# Patient Record
Sex: Female | Born: 1945 | Race: White | Hispanic: No | Marital: Married | Smoking: Former smoker
Health system: Southern US, Community
[De-identification: ages and names within clinical notes are randomized; demographics above are authoritative.]

## PROBLEM LIST (undated history)

## (undated) HISTORY — PX: LOBECTOMY: SHX5089

---

## 2012-03-14 ENCOUNTER — Ambulatory Visit: Payer: Self-pay

## 2012-09-27 DIAGNOSIS — M069 Rheumatoid arthritis, unspecified: Secondary | ICD-10-CM | POA: Insufficient documentation

## 2012-09-27 DIAGNOSIS — G4710 Hypersomnia, unspecified: Secondary | ICD-10-CM | POA: Insufficient documentation

## 2012-12-28 IMAGING — CR DG CHEST 2V
1 series · 2 of 2 positions shown · non-contrast
Comparison: none

REASON FOR EXAM: cough and chest congestion for 2 weeks
COMMENTS:

[Series 1: pa · 0.17mm/px · 2 of 2 slices shown]
[im 1/2]
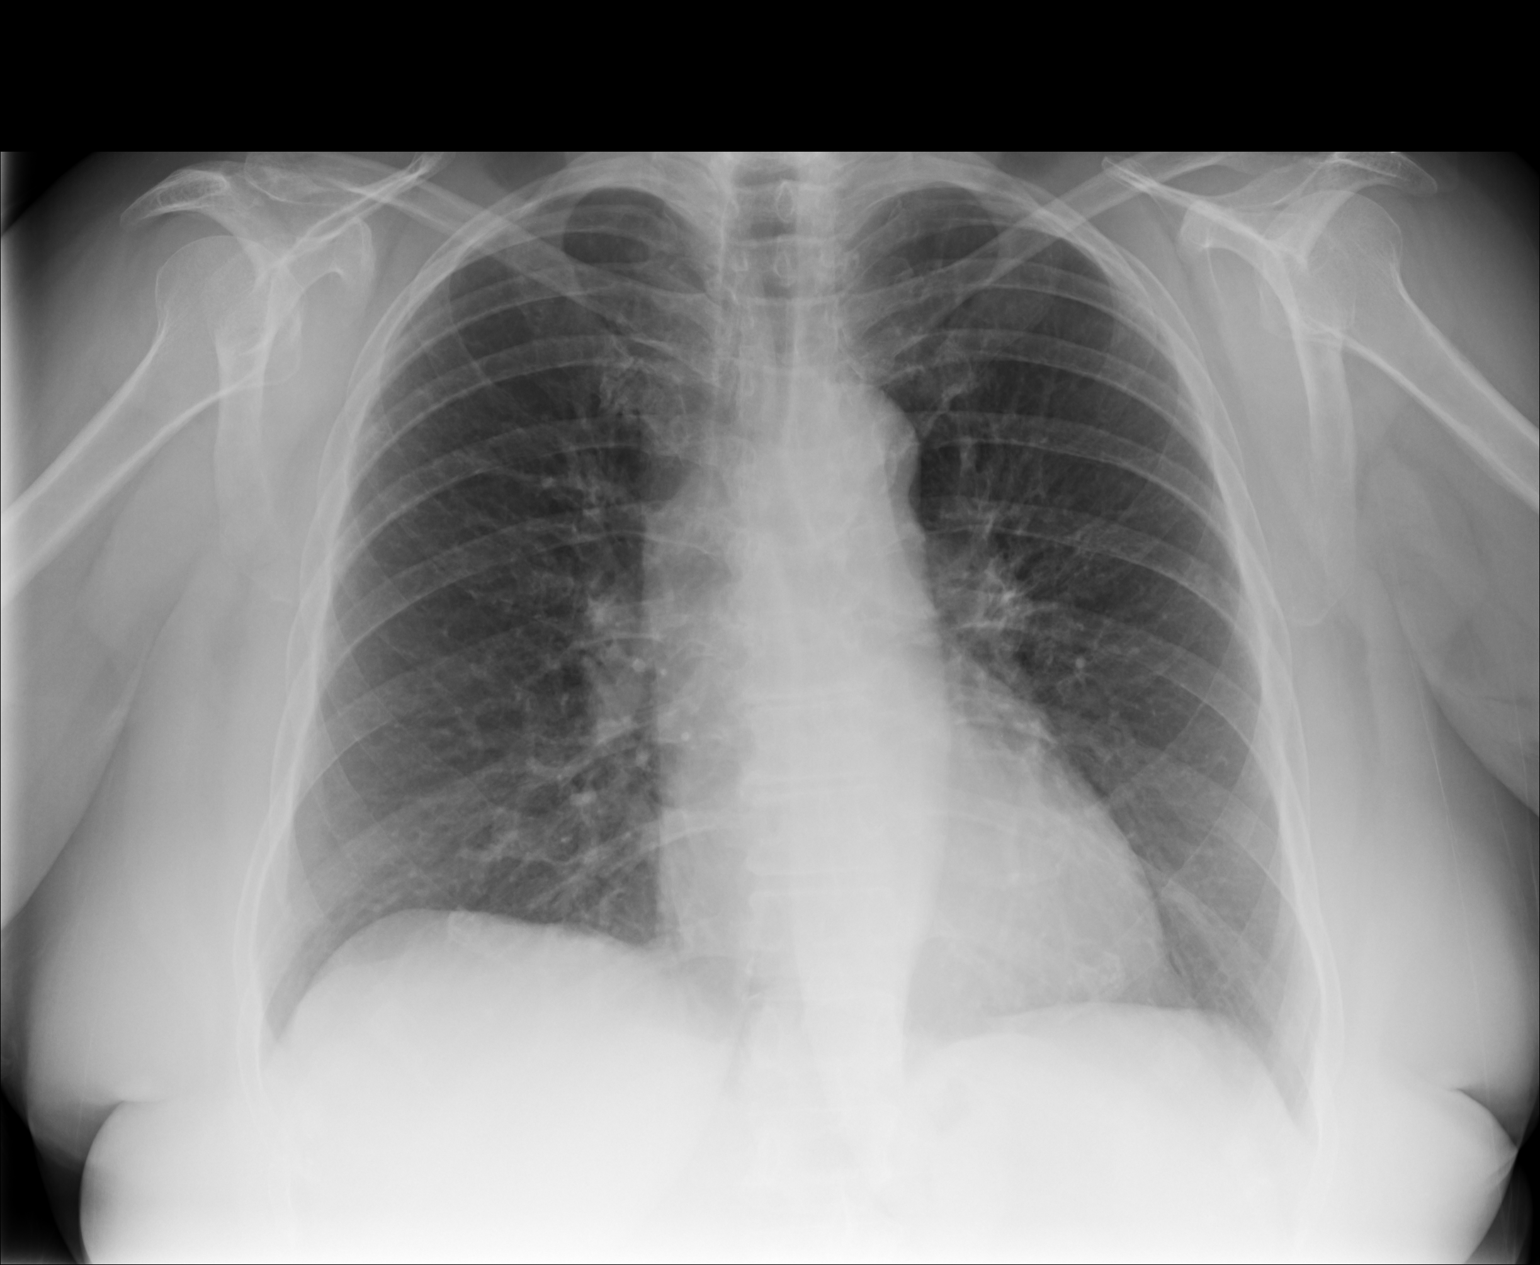
[im 2/2]
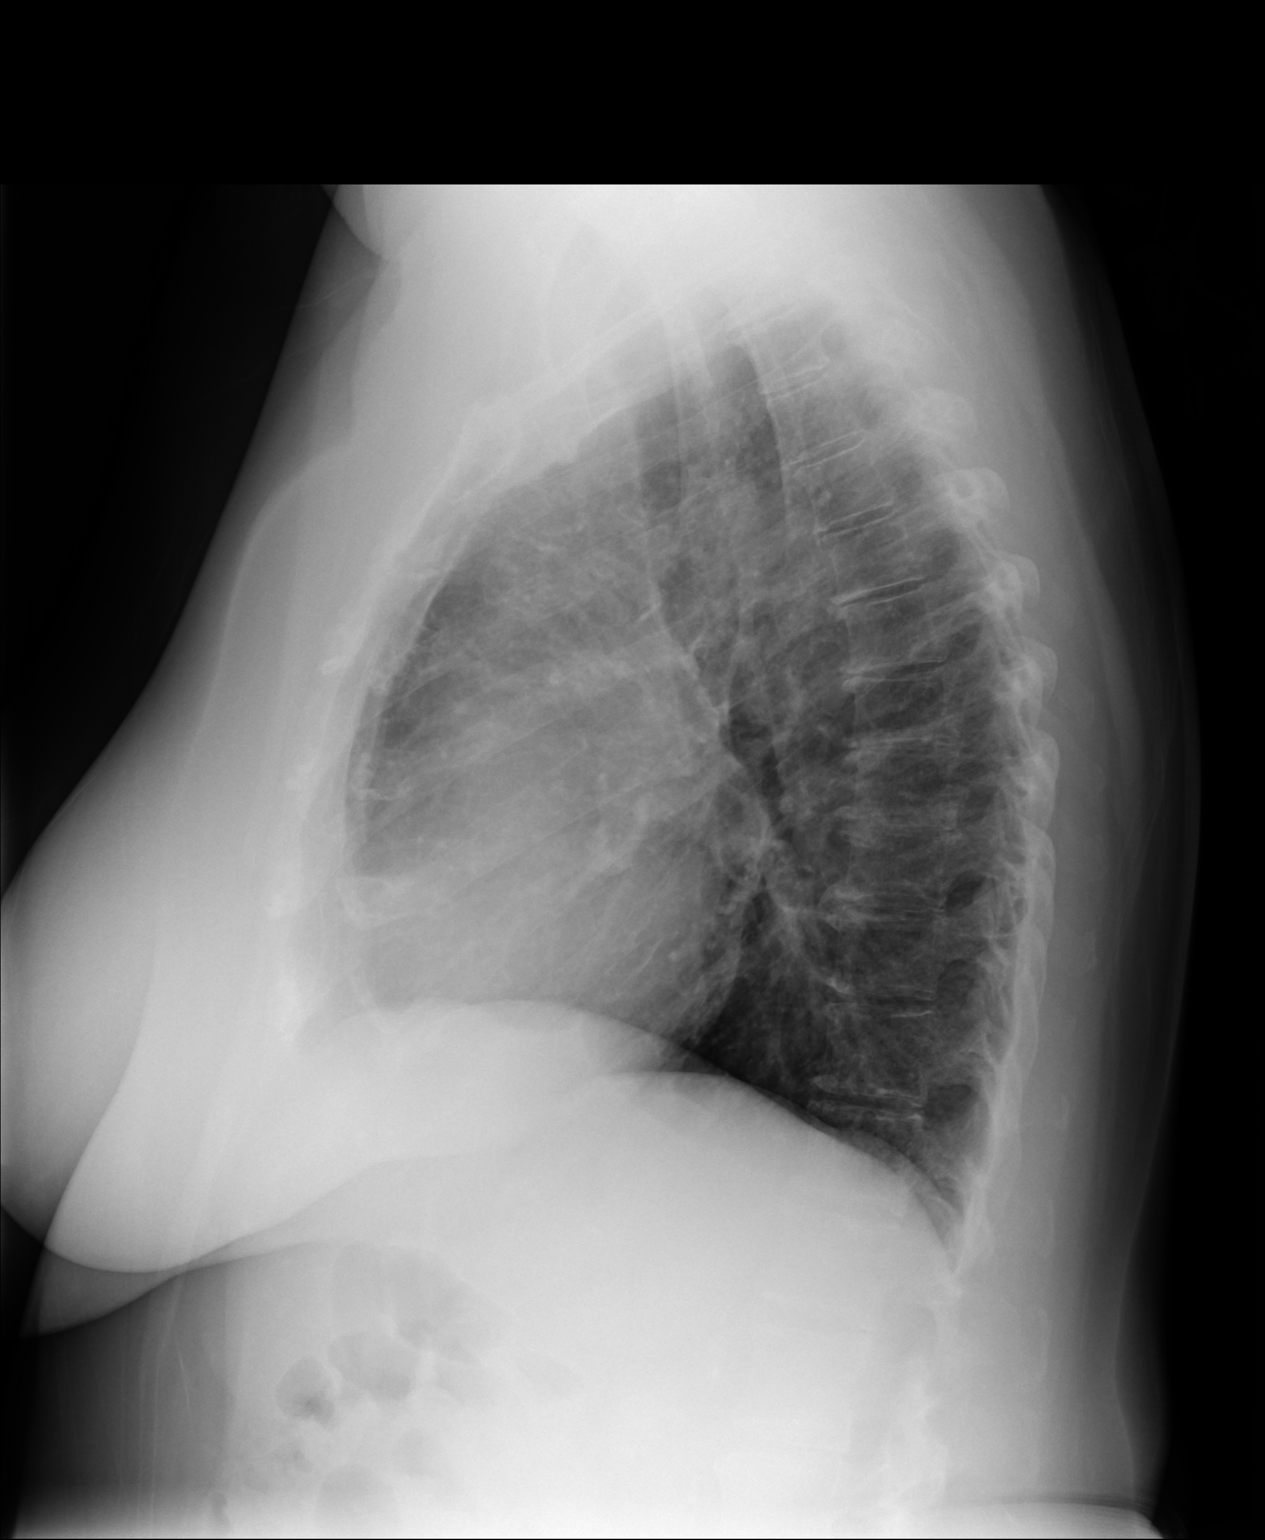

[2 of 2 positions shown; findings below may reference images not displayed]

PROCEDURE:     MDR - MDR CHEST PA(OR AP) AND LATERAL  - March 14, 2012  [DATE]

RESULT:     The lungs are well-expanded. There is no focal infiltrate. The
perihilar lung markings are minimally prominent especially inferiorly on the
right. The cardiac silhouette is normal in size. The pulmonary vascularity
is not engorged. The mediastinum is normal in width. There is mild
tortuosity of the descending thoracic aorta. Degenerative disc space
narrowing is noted at multiple levels of the thoracic spine.
IMPRESSION: There is mild hyperinflation with increased perihilar lung
markings. This suggests acute bronchitis with superimposed subsegmental
atelectasis or early interstitial infiltrates. Followup films are
recommended following therapy to assure complete clearing.

[REDACTED]

## 2016-03-04 DIAGNOSIS — Z902 Acquired absence of lung [part of]: Secondary | ICD-10-CM | POA: Insufficient documentation

## 2017-12-14 DIAGNOSIS — R0902 Hypoxemia: Secondary | ICD-10-CM | POA: Insufficient documentation

## 2017-12-14 DIAGNOSIS — K219 Gastro-esophageal reflux disease without esophagitis: Secondary | ICD-10-CM | POA: Insufficient documentation

## 2017-12-24 DIAGNOSIS — J849 Interstitial pulmonary disease, unspecified: Secondary | ICD-10-CM | POA: Insufficient documentation

## 2020-04-13 DIAGNOSIS — Z8739 Personal history of other diseases of the musculoskeletal system and connective tissue: Secondary | ICD-10-CM | POA: Insufficient documentation

## 2020-09-11 ENCOUNTER — Ambulatory Visit: Payer: Medicare Other

## 2020-10-10 ENCOUNTER — Ambulatory Visit (INDEPENDENT_AMBULATORY_CARE_PROVIDER_SITE_OTHER): Payer: Medicare Other | Admitting: Internal Medicine

## 2020-10-10 VITALS — BP 136/77 | HR 65 | Temp 98.2°F | Resp 18 | Ht 61.0 in | Wt 168.0 lb

## 2020-10-10 DIAGNOSIS — G4733 Obstructive sleep apnea (adult) (pediatric): Secondary | ICD-10-CM | POA: Diagnosis not present

## 2020-10-10 DIAGNOSIS — I10 Essential (primary) hypertension: Secondary | ICD-10-CM | POA: Diagnosis not present

## 2020-10-10 DIAGNOSIS — Z7189 Other specified counseling: Secondary | ICD-10-CM | POA: Diagnosis not present

## 2020-10-10 DIAGNOSIS — Z6830 Body mass index (BMI) 30.0-30.9, adult: Secondary | ICD-10-CM | POA: Diagnosis not present

## 2020-10-10 DIAGNOSIS — C3491 Malignant neoplasm of unspecified part of right bronchus or lung: Secondary | ICD-10-CM | POA: Insufficient documentation

## 2020-10-10 NOTE — Progress Notes (Signed)
[facility] [place], [place] [address]  Pulmonary Sleep Medicine   Office Visit Note  Patient Name: Tammy Kelley DOB: [birth year] MRN [id number]    Chief Complaint: Obstructive Sleep Apnea visit  Brief History:  Tammy Kelley is seen today for follow up The patient has a 15 year history of sleep apnea. Patient is using PAP nightly.  She has been sleeping in the hospital with her husband and was unable to use her machine. He was also in the hospital in September. The patient feels more rested after sleeping with PAP.  The patient reports benefiting from PAP use. Reported sleepiness is  Was improved on CPAP but she is tired now. The Epworth Sleepiness Score is 9 out of 24.  The patient complains of the following: mask leak  The compliance download shows excelent compliance with an average use time of 9 hours. The AHI is 1.7  The patient does not complain of limb movements disrupting sleep.  ROS  General: (-) fever, (-) chills, (-) night sweat Nose and Sinuses: (-) nasal stuffiness or itchiness, (-) postnasal drip, (-) nosebleeds, (-) sinus trouble. Mouth and Throat: (-) sore throat, (-) hoarseness. Neck: (-) swollen glands, (-) enlarged thyroid, (-) neck pain. Respiratory: - cough, - shortness of breath, - wheezing. Neurologic: - numbness, - tingling. Psychiatric: - anxiety, - depression   Current Medication: Outpatient Encounter Medications as of 10/10/2020  Medication Sig  . chlorthalidone (HYGROTON) 25 MG tablet TAKE 1/2 TABLET(12.5 MG) BY MOUTH EVERY MORNING  . lisinopril (ZESTRIL) 20 MG tablet TAKE 1 TABLET(20 MG) BY MOUTH DAILY  . mycophenolate (CELLCEPT) 500 MG tablet Take by mouth.  . predniSONE (DELTASONE) 2.5 MG tablet TAKE 3 TABLETS(7.5 MG) BY MOUTH DAILY  . ranitidine (ZANTAC) 300 MG tablet   . Calcium Carbonate-Vitamin D 600-400 MG-UNIT tablet Take 1 tablet by mouth daily.  . famotidine (PEPCID) 40 MG tablet famotidine 40 mg tablet  TAKE 1 TABLET  BY MOUTH IN THE EVENING  . folic acid (FOLVITE) 1 MG tablet Take 1 mg by mouth daily.   No facility-administered encounter medications on file as of 10/10/2020.    Surgical History: Past Surgical History:  Procedure Laterality Date  . LOBECTOMY     partial right     Medical History: History reviewed. No pertinent past medical history.  Family History: Non contributory to the present illness  Social History: Social History   Socioeconomic History  . Marital status: Married    Spouse name: Not on file  . Number of children: Not on file  . Years of education: Not on file  . Highest education level: Not on file  Occupational History  . Not on file  Tobacco Use  . Smoking status: Former Smoker    Types: Cigarettes    Quit date: 2004    Years since quitting: 17.9  . Smokeless tobacco: Never Used  Substance and Sexual Activity  . Alcohol use: Not on file  . Drug use: Not on file  . Sexual activity: Not on file  Other Topics Concern  . Not on file  Social History Narrative  . Not on file   Social Determinants of Health   Financial Resource Strain:   . Difficulty of Paying Living Expenses: Not on file  Food Insecurity:   . Worried About Charity fundraiser in the Last Year: Not on file  . Ran Out of Food in the Last Year: Not on file  Transportation Needs:   .  Lack of Transportation (Medical): Not on file  . Lack of Transportation (Non-Medical): Not on file  Physical Activity:   . Days of Exercise per Week: Not on file  . Minutes of Exercise per Session: Not on file  Stress:   . Feeling of Stress : Not on file  Social Connections:   . Frequency of Communication with Friends and Family: Not on file  . Frequency of Social Gatherings with Friends and Family: Not on file  . Attends Religious Services: Not on file  . Active Member of Clubs or Organizations: Not on file  . Attends Archivist Meetings: Not on file  . Marital Status: Not on file  Intimate  Partner Violence:   . Fear of Current or Ex-Partner: Not on file  . Emotionally Abused: Not on file  . Physically Abused: Not on file  . Sexually Abused: Not on file    Vital Signs: Blood pressure 136/77, pulse 65, temperature 98.2 F (36.8 C), resp. rate 18, height 5\' 1"  (1.549 m), weight 168 lb (76.2 kg), SpO2 98 %.  Examination: General Appearance: The patient is well-developed, well-nourished, and in no distress. Neck Circumference: 45 cm Skin: Gross inspection of skin unremarkable. Head: normocephalic, no gross deformities. Eyes: no gross deformities noted. ENT: ears appear grossly normal Neurologic: Alert and oriented. No involuntary movements.    EPWORTH SLEEPINESS SCALE:  Scale:  (0)= no chance of dozing; (1)= slight chance of dozing; (2)= moderate chance of dozing; (3)= high chance of dozing  Chance  Situtation    Sitting and reading: 2    Watching TV: 2    Sitting Inactive in public: 0    As a passenger in car: 0      Lying down to rest: 3    Sitting and talking: 0    Sitting quielty after lunch: 2    In a car, stopped in traffic: 0   TOTAL SCORE:   9 out of 24    SLEEP STUDIES:  1. Split 11/29/04 AHI  Spo2 Min   CPAP COMPLIANCE DATA:  Date Range: 10/09/19-10/07/20  Average Daily Use: 8 hours  Median Use: 9  Compliance for > 4 Hours: 85%   AHI: 1.7 respiratory events per hour  Days Used: 309/365  Mask Leak: 42.1  95th Percentile Pressure: CPAP 10cm H20       Assessment and Plan: Patient Active Problem List   Diagnosis Date Noted  . Squamous cell lung cancer, right ([place]) 10/10/2020  . OSA (obstructive sleep apnea) 10/10/2020  . Essential hypertension 10/10/2020  . CPAP use counseling 10/10/2020  . S/P lobectomy of lung 03/04/2016  . Rheumatoid arthritis ([place]) 09/27/2012      The patient does tolerate PAP and reports significant benefit from PAP use. The patient was reminded how to clean the CPAP and advised to stop  using the [place]. Hopefully within a week her husband will be home and she will be back to sleeping with the  CPAP. The compliance was excellent except for when her husband is in the hospital. The apnea is well controlled.   1. OSA- restart CPAP use as soon as possible. 2. CPAP couseling-Discussed importance of adequate CPAP use as well as proper care and cleaning techniques of machine and all supplies. 3. HTN - controlled on medication. Followed and managed by PCP. 4.  Morbid obesity patient BMI is 31.74 diet exercise discussion at length.  Patient needs to increase activity as possible and caloric restriction  Obesity Counseling:  Risk Assessment: An assessment of behavioral risk factors was made today and includes lack of exercise sedentary lifestyle, lack of portion control and poor dietary habits.  Risk Modification Advice: She was counseled on portion control guidelines. Restricting daily caloric intake to 1880. The detrimental long term effects of obesity on her health and ongoing poor compliance was also discussed with the patient.    General Counseling: I have discussed the findings of the evaluation and examination with Tammy Kelley.  I have also discussed any further diagnostic evaluation thatmay be needed or ordered today. Tammy Kelley verbalizes understanding of the findings of todays visit. We also reviewed her medications today and discussed drug interactions and side effects including but not limited excessive drowsiness and altered mental states. We also discussed that there is always a risk not just to her but also people around her. she has been encouraged to call the office with any questions or concerns that should arise related to todays visit.  No orders of the defined types were placed in this encounter.   This patient was seen by Hettie Holstein, AGNP-C in collaboration with Dr. Devona Konig as a part of collaborative care agreement.     I have personally obtained a history,  examined the patient, evaluated laboratory and imaging results, formulated the assessment and plan and placed orders.   Richelle Ito Saunders Glance, PhD, FAASM  Diplomate, American Board of Sleep Medicine    Allyne Gee, MD [facility] Diplomate ABMS Pulmonary and Critical Care Medicine Sleep medicine

## 2020-10-10 NOTE — Patient Instructions (Signed)

## 2020-10-12 ENCOUNTER — Encounter: Payer: Self-pay | Admitting: Internal Medicine

## 2021-10-14 NOTE — Progress Notes (Signed)
[facility] [place], [place] [address]  Pulmonary Sleep Medicine   Office Visit Note  Patient Name: Tammy Kelley DOB: 1946-03-22 MRN [id number]    Chief Complaint: Obstructive Sleep Apnea visit  Brief History:  Kazandra is seen today for annual follow up visit for CPAP@ 10 cmH2O. The patient has a 16 year history of sleep apnea. Patient is using PAP nightly.  The patient feels rested after sleeping with PAP.  The patient reports benefiting from PAP use. Reported sleepiness is improved and the Epworth Sleepiness Score is 5 out of 24. The patient sometimes take naps. The patient complains of the following: none.  The compliance download shows 96% compliance with an average use time of 8 hours 47 minutes. The AHI is 1.8.  The patient does not complain of limb movements disrupting sleep. Continues to wear 2L of oxygen at night, she has ILD.  ROS  General: (-) fever, (-) chills, (-) night sweat Nose and Sinuses: (-) nasal stuffiness or itchiness, (-) postnasal drip, (-) nosebleeds, (-) sinus trouble. Mouth and Throat: (-) sore throat, (-) hoarseness. Neck: (-) swollen glands, (-) enlarged thyroid, (-) neck pain. Respiratory: - cough, + shortness of breath, - wheezing. Neurologic: - numbness, - tingling. Psychiatric: - anxiety, - depression   Current Medication: Outpatient Encounter Medications as of 10/15/2021  Medication Sig   albuterol (VENTOLIN HFA) 108 (90 Base) MCG/ACT inhaler Inhale into the lungs.   mycophenolate (CELLCEPT) 500 MG tablet Take by mouth.   sulfamethoxazole-trimethoprim (BACTRIM) 400-80 MG tablet TAKE 1 TABLET BY MOUTH EVERY MONDAY, WEDNESDAY, FRIDAY AT 4:00 PM   Calcium Carbonate-Vitamin D 600-400 MG-UNIT tablet Take 1 tablet by mouth daily.   chlorthalidone (HYGROTON) 25 MG tablet TAKE 1/2 TABLET(12.5 MG) BY MOUTH EVERY MORNING   famotidine (PEPCID) 40 MG tablet famotidine 40 mg tablet  TAKE 1 TABLET BY MOUTH IN THE EVENING   folic  acid (FOLVITE) 1 MG tablet Take 1 mg by mouth daily.   predniSONE (DELTASONE) 2.5 MG tablet TAKE 3 TABLETS(7.5 MG) BY MOUTH DAILY   ranitidine (ZANTAC) 300 MG tablet    [DISCONTINUED] lisinopril (ZESTRIL) 20 MG tablet TAKE 1 TABLET(20 MG) BY MOUTH DAILY   No facility-administered encounter medications on file as of 10/15/2021.    Surgical History: Past Surgical History:  Procedure Laterality Date   LOBECTOMY     partial right     Medical History: History reviewed. No pertinent past medical history.  Family History: Non contributory to the present illness  Social History: Social History   Socioeconomic History   Marital status: Married    Spouse name: Not on file   Number of children: Not on file   Years of education: Not on file   Highest education level: Not on file  Occupational History   Not on file  Tobacco Use   Smoking status: Former    Types: Cigarettes    Quit date: 2004    Years since quitting: 18.9   Smokeless tobacco: Never  Substance and Sexual Activity   Alcohol use: Not on file   Drug use: Not on file   Sexual activity: Not on file  Other Topics Concern   Not on file  Social History Narrative   Not on file   Social Determinants of Health   Financial Resource Strain: Not on file  Food Insecurity: Not on file  Transportation Needs: Not on file  Physical Activity: Not on file  Stress: Not on file  Social Connections:  Not on file  Intimate Partner Violence: Not on file    Vital Signs: Blood pressure (!) 144/79, pulse 71, resp. rate 18, height 5\' 1"  (1.549 m), weight 172 lb (78 kg), SpO2 97 %. Body mass index is 32.5 kg/m.    Examination: General Appearance: The patient is well-developed, well-nourished, and in no distress. Neck Circumference: 45 cm Skin: Gross inspection of skin unremarkable. Head: normocephalic, no gross deformities. Eyes: no gross deformities noted. ENT: ears appear grossly normal Neurologic: Alert and oriented. No  involuntary movements.    EPWORTH SLEEPINESS SCALE:  Scale:  (0)= no chance of dozing; (1)= slight chance of dozing; (2)= moderate chance of dozing; (3)= high chance of dozing  Chance  Situtation    Sitting and reading: 1    Watching TV: 1    Sitting Inactive in public: 0    As a passenger in car: 0      Lying down to rest: 2    Sitting and talking: 0    Sitting quielty after lunch: 1    In a car, stopped in traffic: 0   TOTAL SCORE:   5 out of 24    SLEEP STUDIES:  Split Study (11/29/2004) RDI 31/hr, min SpO2 not listed in study, CPAP @ 10 cmH2O   CPAP COMPLIANCE DATA:  Date Range: 10/14/2020-10/13/2021  Average Daily Use: 8 hours 47 minutes  Median Use: 8 hours 53 minutes  Compliance for > 4 Hours: 96%  AHI: 1.8 respiratory events per hour  Days Used: 352/365 days  Mask Leak: 37.9  95th Percentile Pressure: 10         LABS: No results found for this or any previous visit (from the past 2160 hour(s)).  Radiology: DG Chest 2 View  Result Date: 03/14/2012 * PRIOR REPORT IMPORTED FROM AN EXTERNAL SYSTEM * PRIOR REPORT IMPORTED FROM THE [facility] WORKFLOW SYSTEM REASON FOR EXAM:    cough and chest congestion for 2 weeks COMMENTS: PROCEDURE:     MDR - MDR CHEST PA(OR AP) AND LATERAL  - Mar 14 2012  1:38PM RESULT:     The lungs are well-expanded. There is no focal infiltrate. The perihilar lung markings are minimally prominent especially inferiorly on the right. The cardiac silhouette is normal in size. The pulmonary vascularity is not engorged. The mediastinum is normal in width. There is mild tortuosity of the descending thoracic aorta. Degenerative disc space narrowing is noted at multiple levels of the thoracic spine. IMPRESSION:      There is mild hyperinflation with increased perihilar lung markings. This suggests acute bronchitis with superimposed subsegmental atelectasis or early interstitial infiltrates. Followup films are recommended following  therapy to assure complete clearing. Dictation Site: 5     No results found.  No results found.    Assessment and Plan: Patient Active Problem List   Diagnosis Date Noted   Squamous cell lung cancer, right ([place]) 10/10/2020   OSA (obstructive sleep apnea) 10/10/2020   Essential hypertension 10/10/2020   CPAP use counseling 10/10/2020   ILD (interstitial lung disease) ([place]) 12/24/2017   GERD (gastroesophageal reflux disease) 12/14/2017   S/P lobectomy of lung 03/04/2016   Rheumatoid arthritis ([place]) 09/27/2012      The patient does tolerate PAP and reports benefit from PAP use. The patient was reminded how to adjust mask fit and advised to change supplies regularly. The patient was also counselled on nightly use. The compliance is excellent. The AHI is 1.8.   1. OSA (obstructive sleep apnea)  Continue excellent compliance  2. CPAP use counseling CPAP couseling-Discussed importance of adequate CPAP use as well as proper care and cleaning techniques of machine and all supplies.  3. Essential hypertension Currently not on any meds due to recent low Bps, has appt with cardiology tomorrow due to palpitations and will address Bp as well.  4. ILD (interstitial lung disease) ([place]) Followed by pulmonology, continue oxygen at night as prescribed.  5. Gastroesophageal reflux disease without esophagitis Continue Pepcid  6. Obesity (BMI 30.0-34.9) Obesity Counseling: Had a lengthy discussion regarding patients BMI and weight issues. Patient was instructed on portion control as well as increased activity. Also discussed caloric restrictions with trying to maintain intake less than 2000 Kcal. Discussions were made in accordance with the 5As of weight management. Simple actions such as not eating late and if able to, taking a walk is suggested.   General Counseling: I have discussed the findings of the evaluation and examination with Kennyth Lose.  I have also discussed any further diagnostic  evaluation thatmay be needed or ordered today. Monicia verbalizes understanding of the findings of todays visit. We also reviewed her medications today and discussed drug interactions and side effects including but not limited excessive drowsiness and altered mental states. We also discussed that there is always a risk not just to her but also people around her. she has been encouraged to call the office with any questions or concerns that should arise related to todays visit.  No orders of the defined types were placed in this encounter.       I have personally obtained a history, examined the patient, evaluated laboratory and imaging results, formulated the assessment and plan and placed orders.  This patient was seen by Drema Dallas, PA-C in collaboration with Dr. Devona Konig as a part of collaborative care agreement.  Allyne Gee, MD [facility] Diplomate ABMS Pulmonary Critical Care Medicine and Sleep Medicine

## 2021-10-15 ENCOUNTER — Ambulatory Visit (INDEPENDENT_AMBULATORY_CARE_PROVIDER_SITE_OTHER): Payer: Medicare Other | Admitting: Internal Medicine

## 2021-10-15 VITALS — BP 144/79 | HR 71 | Resp 18 | Ht 61.0 in | Wt 172.0 lb

## 2021-10-15 DIAGNOSIS — K219 Gastro-esophageal reflux disease without esophagitis: Secondary | ICD-10-CM

## 2021-10-15 DIAGNOSIS — I10 Essential (primary) hypertension: Secondary | ICD-10-CM

## 2021-10-15 DIAGNOSIS — J849 Interstitial pulmonary disease, unspecified: Secondary | ICD-10-CM | POA: Diagnosis not present

## 2021-10-15 DIAGNOSIS — Z7189 Other specified counseling: Secondary | ICD-10-CM | POA: Diagnosis not present

## 2021-10-15 DIAGNOSIS — G4733 Obstructive sleep apnea (adult) (pediatric): Secondary | ICD-10-CM

## 2021-10-15 DIAGNOSIS — E669 Obesity, unspecified: Secondary | ICD-10-CM

## 2021-10-15 NOTE — Patient Instructions (Signed)

## 2021-11-18 DIAGNOSIS — I493 Ventricular premature depolarization: Secondary | ICD-10-CM | POA: Insufficient documentation

## 2021-12-18 ENCOUNTER — Other Ambulatory Visit: Payer: Self-pay

## 2021-12-18 ENCOUNTER — Encounter: Payer: Medicare Other | Attending: Internal Medicine | Admitting: *Deleted

## 2021-12-18 DIAGNOSIS — R0600 Dyspnea, unspecified: Secondary | ICD-10-CM | POA: Insufficient documentation

## 2021-12-18 DIAGNOSIS — Z9889 Other specified postprocedural states: Secondary | ICD-10-CM | POA: Insufficient documentation

## 2021-12-18 DIAGNOSIS — R0602 Shortness of breath: Secondary | ICD-10-CM | POA: Insufficient documentation

## 2021-12-18 DIAGNOSIS — J849 Interstitial pulmonary disease, unspecified: Secondary | ICD-10-CM | POA: Insufficient documentation

## 2021-12-18 NOTE — Progress Notes (Signed)
Initial telephone orientation completed. Diagnosis can be found in [facility] 12/19. EP orientation scheduled for Wednesday 2/22 at 10:30am.

## 2022-01-01 ENCOUNTER — Other Ambulatory Visit: Payer: Self-pay

## 2022-01-01 VITALS — Ht 61.2 in | Wt 166.9 lb

## 2022-01-01 DIAGNOSIS — J849 Interstitial pulmonary disease, unspecified: Secondary | ICD-10-CM | POA: Diagnosis not present

## 2022-01-01 DIAGNOSIS — R0602 Shortness of breath: Secondary | ICD-10-CM | POA: Diagnosis not present

## 2022-01-01 NOTE — Patient Instructions (Signed)
Patient Instructions  Patient Details  Name: Tammy Kelley MRN: [id number] Date of Birth: [birth year] Referring Provider:  Patrina Levering, MD  Below are your personal goals for exercise, nutrition, and risk factors. Our goal is to help you stay on track towards obtaining and maintaining these goals. We will be discussing your progress on these goals with you throughout the program.  Initial Exercise Prescription:  Initial Exercise Prescription - 01/01/22 1200       Date of Initial Exercise RX and Referring Provider   Date 01/01/22    Referring Provider Deon Pilling MD      Oxygen   Maintain Oxygen Saturation 88% or higher      NuStep   Level 1    SPM 80    Minutes 15    METs 1.6      REL-XR   Level 1    Speed 50    Minutes 15    METs 1.6      Biostep-RELP   Level 1    SPM 50    Minutes 15    METs 1.5      Track   Laps 24    Minutes 15    METs 2.31      Prescription Details   Frequency (times per week) 2    Duration Progress to 30 minutes of continuous aerobic without signs/symptoms of physical distress      Intensity   THRR 40-80% of Max Heartrate 99 - 130    Ratings of Perceived Exertion 11-13    Perceived Dyspnea 0-4      Progression   Progression Continue to progress workloads to maintain intensity without signs/symptoms of physical distress.      Resistance Training   Training Prescription Yes    Weight 3 lb    Reps 10-15             Exercise Goals: Frequency: Be able to perform aerobic exercise two to three times per week in program working toward 2-5 days per week of home exercise.  Intensity: Work with a perceived exertion of 11 (fairly light) - 15 (hard) while following your exercise prescription.  We will make changes to your prescription with you as you progress through the program.   Duration: Be able to do 30 to 45 minutes of continuous aerobic exercise in addition to a 5 minute warm-up and a 5 minute cool-down routine.   Nutrition  Goals: Your personal nutrition goals will be established when you do your nutrition analysis with the dietician.  The following are general nutrition guidelines to follow: Cholesterol < 200mg /day Sodium < 1500mg /day Fiber: Women over 50 yrs - 21 grams per day  Personal Goals:  Personal Goals and Risk Factors at Admission - 01/01/22 1223       Core Components/Risk Factors/Patient Goals on Admission    Weight Management Yes;Weight Loss    Intervention Weight Management: Develop a combined nutrition and exercise program designed to reach desired caloric intake, while maintaining appropriate intake of nutrient and fiber, sodium and fats, and appropriate energy expenditure required for the weight goal.;Weight Management: Provide education and appropriate resources to help participant work on and attain dietary goals.;Weight Management/Obesity: Establish reasonable short term and long term weight goals.    Admit Weight 166 lb (75.3 kg)    Goal Weight: Short Term 161 lb (73 kg)    Goal Weight: Long Term 156 lb (70.8 kg)    Expected Outcomes Long Term: Adherence to nutrition and  physical activity/exercise program aimed toward attainment of established weight goal;Short Term: Continue to assess and modify interventions until short term weight is achieved;Weight Loss: Understanding of general recommendations for a balanced deficit meal plan, which promotes 1-2 lb weight loss per week and includes a negative energy balance of [phone] kcal/d;Understanding recommendations for meals to include 15-35% energy as protein, 25-35% energy from fat, 35-60% energy from carbohydrates, less than 200mg  of dietary cholesterol, 20-35 gm of total fiber daily;Understanding of distribution of calorie intake throughout the day with the consumption of 4-5 meals/snacks    Improve shortness of breath with ADL's Yes    Intervention Provide education, individualized exercise plan and daily activity instruction to help decrease  symptoms of SOB with activities of daily living.    Expected Outcomes Short Term: Improve cardiorespiratory fitness to achieve a reduction of symptoms when performing ADLs;Long Term: Be able to perform more ADLs without symptoms or delay the onset of symptoms    Increase knowledge of respiratory medications and ability to use respiratory devices properly  Yes    Intervention Provide education and demonstration as needed of appropriate use of medications, inhalers, and oxygen therapy.    Expected Outcomes Short Term: Achieves understanding of medications use. Understands that oxygen is a medication prescribed by physician. Demonstrates appropriate use of inhaler and oxygen therapy.;Long Term: Maintain appropriate use of medications, inhalers, and oxygen therapy.    Hypertension Yes    Intervention Provide education on lifestyle modifcations including regular physical activity/exercise, weight management, moderate sodium restriction and increased consumption of fresh fruit, vegetables, and low fat dairy, alcohol moderation, and smoking cessation.;Monitor prescription use compliance.    Expected Outcomes Short Term: Continued assessment and intervention until BP is < 140/83mm HG in hypertensive participants. < 130/86mm HG in hypertensive participants with diabetes, heart failure or chronic kidney disease.;Long Term: Maintenance of blood pressure at goal levels.             Tobacco Use Initial Evaluation: Social History   Tobacco Use  Smoking Status Former   Types: Cigarettes   Quit date: 2004   Years since quitting: 19.1  Smokeless Tobacco Never    Exercise Goals and Review:  Exercise Goals     Row Name 01/01/22 1223             Exercise Goals   Increase Physical Activity Yes       Intervention Provide advice, education, support and counseling about physical activity/exercise needs.;Develop an individualized exercise prescription for aerobic and resistive training based on initial  evaluation findings, risk stratification, comorbidities and participant's personal goals.       Expected Outcomes Short Term: Attend rehab on a regular basis to increase amount of physical activity.;Long Term: Add in home exercise to make exercise part of routine and to increase amount of physical activity.;Long Term: Exercising regularly at least 3-5 days a week.       Increase Strength and Stamina Yes       Intervention Provide advice, education, support and counseling about physical activity/exercise needs.;Develop an individualized exercise prescription for aerobic and resistive training based on initial evaluation findings, risk stratification, comorbidities and participant's personal goals.       Expected Outcomes Short Term: Increase workloads from initial exercise prescription for resistance, speed, and METs.;Short Term: Perform resistance training exercises routinely during rehab and add in resistance training at home;Long Term: Improve cardiorespiratory fitness, muscular endurance and strength as measured by increased METs and functional capacity (6MWT)  Able to understand and use rate of perceived exertion (RPE) scale Yes       Intervention Provide education and explanation on how to use RPE scale       Expected Outcomes Short Term: Able to use RPE daily in rehab to express subjective intensity level;Long Term:  Able to use RPE to guide intensity level when exercising independently       Able to understand and use Dyspnea scale Yes       Intervention Provide education and explanation on how to use Dyspnea scale       Expected Outcomes Short Term: Able to use Dyspnea scale daily in rehab to express subjective sense of shortness of breath during exertion;Long Term: Able to use Dyspnea scale to guide intensity level when exercising independently       Knowledge and understanding of Target Heart Rate Range (THRR) Yes       Intervention Provide education and explanation of THRR including how  the numbers were predicted and where they are located for reference       Expected Outcomes Short Term: Able to state/look up THRR;Long Term: Able to use THRR to govern intensity when exercising independently;Short Term: Able to use daily as guideline for intensity in rehab       Able to check pulse independently Yes       Intervention Provide education and demonstration on how to check pulse in carotid and radial arteries.;Review the importance of being able to check your own pulse for safety during independent exercise       Expected Outcomes Short Term: Able to explain why pulse checking is important during independent exercise;Long Term: Able to check pulse independently and accurately       Understanding of Exercise Prescription Yes       Intervention Provide education, explanation, and written materials on patient's individual exercise prescription       Expected Outcomes Short Term: Able to explain program exercise prescription;Long Term: Able to explain home exercise prescription to exercise independently                Copy of goals given to participant.

## 2022-01-01 NOTE — Progress Notes (Signed)
Pulmonary Individual Treatment Plan  Patient Details  Name: Tammy Kelley MRN: [id number] Date of Birth: 1945/12/29 Referring Provider:   [name] Pulmonary Rehab from 01/01/2022 in [facility] Cardiac and Pulmonary Rehab  Referring Provider Tammy Pilling MD       Initial Encounter Date:  Flowsheet Row Pulmonary Rehab from 01/01/2022 in [facility] Cardiac and Pulmonary Rehab  Date 01/01/22       Visit Diagnosis: ILD (interstitial lung disease) ([place])  Patient's Home Medications on Admission:  Current Outpatient Medications:    albuterol (VENTOLIN HFA) 108 (90 Base) MCG/ACT inhaler, Inhale into the lungs., Disp: , Rfl:    amoxicillin (AMOXIL) 500 MG capsule, TK FOUR CS PO 1 HOUR B DAPP, Disp: , Rfl:    Calcium Carbonate-Vitamin D 600-400 MG-UNIT tablet, Take 1 tablet by mouth daily., Disp: , Rfl:    chlorthalidone (HYGROTON) 25 MG tablet, TAKE 1/2 TABLET(12.5 MG) BY MOUTH EVERY MORNING, Disp: , Rfl:    codeine 30 MG tablet, Take by mouth., Disp: , Rfl:    ergocalciferol (VITAMIN D2) 1.25 MG (50000 UT) capsule, Take by mouth., Disp: , Rfl:    famotidine (PEPCID) 40 MG tablet, famotidine 40 mg tablet  TAKE 1 TABLET BY MOUTH IN THE EVENING, Disp: , Rfl:    fluticasone (FLONASE) 50 MCG/ACT nasal spray, SHAKE LIQUID AND USE 1 SPRAY IN EACH NOSTRIL DAILY, Disp: , Rfl:    folic acid (FOLVITE) 1 MG tablet, Take 1 mg by mouth daily. (Patient not taking: Reported on 12/18/2021), Disp: , Rfl:    ipratropium (ATROVENT) 0.06 % nasal spray, 2 sprays into each nostril Three (3) times a day., Disp: , Rfl:    meloxicam (MOBIC) 15 MG tablet, Take 15 mg by mouth daily., Disp: , Rfl:    metoprolol succinate (TOPROL-XL) 25 MG 24 hr tablet, every evening, Disp: , Rfl:    mycophenolate (CELLCEPT) 500 MG tablet, Take by mouth., Disp: , Rfl:    predniSONE (DELTASONE) 2.5 MG tablet, TAKE 3 TABLETS(7.5 MG) BY MOUTH DAILY, Disp: , Rfl:    ranitidine (ZANTAC) 300 MG tablet, , Disp: , Rfl:    sulfamethoxazole-trimethoprim  (BACTRIM) 400-80 MG tablet, TAKE 1 TABLET BY MOUTH EVERY MONDAY, WEDNESDAY, FRIDAY AT 4:00 PM, Disp: , Rfl:    traZODone (DESYREL) 100 MG tablet, Take by mouth., Disp: , Rfl:   Past Medical History: No past medical history on file.  Tobacco Use: Social History   Tobacco Use  Smoking Status Former   Types: Cigarettes   Quit date: 2004   Years since quitting: 19.1  Smokeless Tobacco Never    Labs: Recent Review Flowsheet Data   There is no flowsheet data to display.      Pulmonary Assessment Scores:  Pulmonary Assessment Scores     Row Name 01/01/22 1159         ADL UCSD   ADL Phase Entry     SOB Score total 52     Rest 0     Walk 2     Stairs 4     Bath 2     Dress 2     Shop 2       CAT Score   CAT Score 11       mMRC Score   mMRC Score 2              UCSD: Self-administered rating of dyspnea associated with activities of daily living (ADLs) 6-point scale (0 = "not at all" to 5 = "maximal  or unable to do because of breathlessness")  Scoring Scores range from 0 to 120.  Minimally important difference is 5 units  CAT: CAT can identify the health impairment of COPD patients and is better correlated with disease progression.  CAT has a scoring range of zero to 40. The CAT score is classified into four groups of low (less than 10), medium (10 - 20), high (21-30) and very high (31-40) based on the impact level of disease on health status. A CAT score over 10 suggests significant symptoms.  A worsening CAT score could be explained by an exacerbation, poor medication adherence, poor inhaler technique, or progression of COPD or comorbid conditions.  CAT MCID is 2 points  mMRC: mMRC (Modified Medical Research Council) Dyspnea Scale is used to assess the degree of baseline functional disability in patients of respiratory disease due to dyspnea. No minimal important difference is established. A decrease in score of 1 point or greater is considered a positive  change.   Pulmonary Function Assessment:   Exercise Target Goals: Exercise Program Goal: Individual exercise prescription set using results from initial 6 min walk test and THRR while considering  patients activity barriers and safety.   Exercise Prescription Goal: Initial exercise prescription builds to 30-45 minutes a day of aerobic activity, 2-3 days per week.  Home exercise guidelines will be given to patient during program as part of exercise prescription that the participant will acknowledge.  Education: Aerobic Exercise: - Group verbal and visual presentation on the components of exercise prescription. Introduces F.I.T.T principle from ACSM for exercise prescriptions.  Reviews F.I.T.T. principles of aerobic exercise including progression. Written material given at graduation.   Education: Resistance Exercise: - Group verbal and visual presentation on the components of exercise prescription. Introduces F.I.T.T principle from ACSM for exercise prescriptions  Reviews F.I.T.T. principles of resistance exercise including progression. Written material given at graduation.    Education: Exercise & Equipment Safety: - Individual verbal instruction and demonstration of equipment use and safety with use of the equipment. Flowsheet Row Pulmonary Rehab from 01/01/2022 in [facility] Cardiac and Pulmonary Rehab  Education need identified 01/01/22  Date 01/01/22  Educator [place]  Instruction Review Code 1- Verbalizes Understanding       Education: Exercise Physiology & General Exercise Guidelines: - Group verbal and written instruction with models to review the exercise physiology of the cardiovascular system and associated critical values. Provides general exercise guidelines with specific guidelines to those with heart or lung disease.    Education: Flexibility, Balance, Mind/Body Relaxation: - Group verbal and visual presentation with interactive activity on the components of exercise  prescription. Introduces F.I.T.T principle from ACSM for exercise prescriptions. Reviews F.I.T.T. principles of flexibility and balance exercise training including progression. Also discusses the mind body connection.  Reviews various relaxation techniques to help reduce and manage stress (i.e. Deep breathing, progressive muscle relaxation, and visualization). Balance handout provided to take home. Written material given at graduation.   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 HR 89 bpm     Max Ex. BP 142/72     2 Minute Post BP 122/70       Interval HR   1 Minute HR 81     2 Minute HR 85     3 Minute HR 86     4 Minute HR 87     5 Minute HR 89     6 Minute HR 89     2 Minute Post HR 71     Interval Heart Rate? Yes       Interval Oxygen   Interval Oxygen? Yes     Baseline Oxygen Saturation % 95 %     1 Minute Oxygen Saturation % 92 %     1 Minute Liters of Oxygen 0 L  RA     2 Minute Oxygen Saturation % 89 %     2 Minute Liters of Oxygen 0 L     3 Minute Oxygen Saturation % 88 %     3 Minute Liters of Oxygen 0 L     4 Minute Oxygen Saturation % 89 %     4 Minute Liters of Oxygen 0 L     5 Minute Oxygen Saturation % 88 %     5 Minute Liters of Oxygen 0 L     6 Minute Oxygen Saturation  % 89 %     6 Minute Liters of Oxygen 0 L     2 Minute Post Oxygen Saturation % 95 %     2 Minute Post Liters of Oxygen 0 L             Oxygen Initial Assessment:  Oxygen Initial Assessment - 01/01/22 1159       Home Oxygen   Home Oxygen Device None    Sleep Oxygen Prescription Continuous;CPAP    Liters per minute 1    Home Exercise Oxygen Prescription None    Home Resting Oxygen Prescription None    Compliance with Home Oxygen Use Yes      Initial 6 min Walk   Oxygen Used None      Program Oxygen Prescription   Program Oxygen Prescription None      Intervention   Short Term Goals To learn and exhibit compliance with exercise, home and travel O2 prescription;To learn and understand importance of monitoring SPO2 with pulse oximeter and demonstrate accurate use of the pulse oximeter.;To learn and understand importance of maintaining oxygen saturations>88%;To learn and demonstrate proper pursed lip breathing techniques or other breathing techniques. ;To learn and demonstrate proper use of respiratory medications    Long  Term Goals Exhibits compliance with exercise, home  and travel O2 prescription;Verbalizes importance of monitoring SPO2 with pulse oximeter and return demonstration;Maintenance of O2 saturations>88%;Exhibits proper breathing techniques, such as pursed lip breathing or other method taught during program session;Compliance with respiratory medication;Demonstrates proper use  of MDIs             Oxygen Re-Evaluation:   Oxygen Discharge (Final Oxygen Re-Evaluation):   Initial Exercise Prescription:  Initial Exercise Prescription - 01/01/22 1200       Date of Initial Exercise RX and Referring Provider   Date 01/01/22    Referring Provider Tammy Pilling MD      Oxygen   Maintain Oxygen Saturation 88% or higher      NuStep   Level 1    SPM 80    Minutes 15    METs 1.6      REL-XR   Level 1    Speed 50    Minutes 15    METs 1.6      Biostep-RELP    Level 1    SPM 50    Minutes 15    METs 1.5      Track   Laps 24    Minutes 15    METs 2.31      Prescription Details   Frequency (times per week) 2    Duration Progress to 30 minutes of continuous aerobic without signs/symptoms of physical distress      Intensity   THRR 40-80% of Max Heartrate 99 - 130    Ratings of Perceived Exertion 11-13    Perceived Dyspnea 0-4      Progression   Progression Continue to progress workloads to maintain intensity without signs/symptoms of physical distress.      Resistance Training   Training Prescription Yes    Weight 3 lb    Reps 10-15             Perform Capillary Blood Glucose checks as needed.  Exercise Prescription Changes:   Exercise Prescription Changes     Row Name 01/01/22 1200             Response to Exercise   Blood Pressure (Admit) 126/70       Blood Pressure (Exercise) 142/72       Blood Pressure (Exit) 122/70       Heart Rate (Admit) 69 bpm       Heart Rate (Exercise) 89 bpm       Heart Rate (Exit) 71 bpm       Oxygen Saturation (Admit) 95 %       Oxygen Saturation (Exercise) 88 %       Oxygen Saturation (Exit) 95 %       Rating of Perceived Exertion (Exercise) 8       Perceived Dyspnea (Exercise) 1       Symptoms SOB       Comments walk test results                Exercise Comments:   Exercise Goals and Review:   Exercise Goals     Row Name 01/01/22 1223             Exercise Goals   Increase Physical Activity Yes       Intervention Provide advice, education, support and counseling about physical activity/exercise needs.;Develop an individualized exercise prescription for aerobic and resistive training based on initial evaluation findings, risk stratification, comorbidities and participant's personal goals.       Expected Outcomes Short Term: Attend rehab on a regular basis to increase amount of physical activity.;Long Term: Add in home exercise to make exercise part of routine and  to increase amount of physical activity.;Long Term: Exercising regularly at least 3-5 days a  week.       Increase Strength and Stamina Yes       Intervention Provide advice, education, support and counseling about physical activity/exercise needs.;Develop an individualized exercise prescription for aerobic and resistive training based on initial evaluation findings, risk stratification, comorbidities and participant's personal goals.       Expected Outcomes Short Term: Increase workloads from initial exercise prescription for resistance, speed, and METs.;Short Term: Perform resistance training exercises routinely during rehab and add in resistance training at home;Long Term: Improve cardiorespiratory fitness, muscular endurance and strength as measured by increased METs and functional capacity (6MWT)       Able to understand and use rate of perceived exertion (RPE) scale Yes       Intervention Provide education and explanation on how to use RPE scale       Expected Outcomes Short Term: Able to use RPE daily in rehab to express subjective intensity level;Long Term:  Able to use RPE to guide intensity level when exercising independently       Able to understand and use Dyspnea scale Yes       Intervention Provide education and explanation on how to use Dyspnea scale       Expected Outcomes Short Term: Able to use Dyspnea scale daily in rehab to express subjective sense of shortness of breath during exertion;Long Term: Able to use Dyspnea scale to guide intensity level when exercising independently       Knowledge and understanding of Target Heart Rate Range (THRR) Yes       Intervention Provide education and explanation of THRR including how the numbers were predicted and where they are located for reference       Expected Outcomes Short Term: Able to state/look up THRR;Long Term: Able to use THRR to govern intensity when exercising independently;Short Term: Able to use daily as guideline for intensity in  rehab       Able to check pulse independently Yes       Intervention Provide education and demonstration on how to check pulse in carotid and radial arteries.;Review the importance of being able to check your own pulse for safety during independent exercise       Expected Outcomes Short Term: Able to explain why pulse checking is important during independent exercise;Long Term: Able to check pulse independently and accurately       Understanding of Exercise Prescription Yes       Intervention Provide education, explanation, and written materials on patient's individual exercise prescription       Expected Outcomes Short Term: Able to explain program exercise prescription;Long Term: Able to explain home exercise prescription to exercise independently                Exercise Goals Re-Evaluation :   Discharge Exercise Prescription (Final Exercise Prescription Changes):  Exercise Prescription Changes - 01/01/22 1200       Response to Exercise   Blood Pressure (Admit) 126/70    Blood Pressure (Exercise) 142/72    Blood Pressure (Exit) 122/70    Heart Rate (Admit) 69 bpm    Heart Rate (Exercise) 89 bpm    Heart Rate (Exit) 71 bpm    Oxygen Saturation (Admit) 95 %    Oxygen Saturation (Exercise) 88 %    Oxygen Saturation (Exit) 95 %    Rating of Perceived Exertion (Exercise) 8    Perceived Dyspnea (Exercise) 1    Symptoms SOB    Comments walk test results  Nutrition:  Target Goals: Understanding of nutrition guidelines, daily intake of sodium 1500mg , cholesterol 200mg , calories 30% from fat and 7% or less from saturated fats, daily to have 5 or more servings of fruits and vegetables.  Education: All About Nutrition: -Group instruction provided by verbal, written material, interactive activities, discussions, models, and posters to present general guidelines for heart healthy nutrition including fat, fiber, MyPlate, the role of sodium in heart healthy nutrition,  utilization of the nutrition label, and utilization of this knowledge for meal planning. Follow up email sent as well. Written material given at graduation.   Biometrics:  Pre Biometrics - 01/01/22 1217       Pre Biometrics   Height 5' 1.2" (1.554 m)    Weight 166 lb 14.4 oz (75.7 kg)    BMI (Calculated) 31.35    Single Leg Stand 4.63 seconds              Nutrition Therapy Plan and Nutrition Goals:  Nutrition Therapy & Goals - 01/01/22 1201       Intervention Plan   Intervention Prescribe, educate and counsel regarding individualized specific dietary modifications aiming towards targeted core components such as weight, hypertension, lipid management, diabetes, heart failure and other comorbidities.    Expected Outcomes Short Term Goal: Understand basic principles of dietary content, such as calories, fat, sodium, cholesterol and nutrients.;Short Term Goal: A plan has been developed with personal nutrition goals set during dietitian appointment.;Long Term Goal: Adherence to prescribed nutrition plan.             Nutrition Assessments:  MEDIFICTS Score Key: ?70 Need to make dietary changes  40-70 Heart Healthy Diet ? 40 Therapeutic Level Cholesterol Diet  Flowsheet Row Pulmonary Rehab from 01/01/2022 in [facility] Cardiac and Pulmonary Rehab  Picture Your Plate Total Score on Admission 52      Picture Your Plate Scores: <58 Unhealthy dietary pattern with much room for improvement. 41-50 Dietary pattern unlikely to meet recommendations for good health and room for improvement. 51-60 More healthful dietary pattern, with some room for improvement.  >60 Healthy dietary pattern, although there may be some specific behaviors that could be improved.   Nutrition Goals Re-Evaluation:   Nutrition Goals Discharge (Final Nutrition Goals Re-Evaluation):   Psychosocial: Target Goals: Acknowledge presence or absence of significant depression and/or stress, maximize coping skills,  provide positive support system. Participant is able to verbalize types and ability to use techniques and skills needed for reducing stress and depression.   Education: Stress, Anxiety, and Depression - Group verbal and visual presentation to define topics covered.  Reviews how body is impacted by stress, anxiety, and depression.  Also discusses healthy ways to reduce stress and to treat/manage anxiety and depression.  Written material given at graduation.   Education: Sleep Hygiene -Provides group verbal and written instruction about how sleep can affect your health.  Define sleep hygiene, discuss sleep cycles and impact of sleep habits. Review good sleep hygiene tips.    Initial Review & Psychosocial Screening:  Initial Psych Review & Screening - 12/18/21 1539       Initial Review   Current issues with Current Sleep Concerns;Current Stress Concerns    Source of Stress Concerns Family    Comments husband has lymphoma (diagnosed in 2021)      [place]? Yes   family, church     Barriers   Psychosocial barriers to participate in program There are no identifiable barriers or psychosocial needs.;The  patient should benefit from training in stress management and relaxation.      Screening Interventions   Interventions Encouraged to exercise;Provide feedback about the scores to participant;To provide support and resources with identified psychosocial needs    Expected Outcomes Short Term goal: Utilizing psychosocial counselor, staff and physician to assist with identification of specific Stressors or current issues interfering with healing process. Setting desired goal for each stressor or current issue identified.;Long Term Goal: Stressors or current issues are controlled or eliminated.;Short Term goal: Identification and review with participant of any Quality of Life or Depression concerns found by scoring the questionnaire.;Long Term goal: The participant improves  quality of Life and PHQ9 Scores as seen by post scores and/or verbalization of changes             Quality of Life Scores:  Scores of 19 and below usually indicate a poorer quality of life in these areas.  A difference of  2-3 points is a clinically meaningful difference.  A difference of 2-3 points in the total score of the Quality of Life Index has been associated with significant improvement in overall quality of life, self-image, physical symptoms, and general health in studies assessing change in quality of life.  PHQ-9: Recent Review Flowsheet Data     Depression screen [facility] 2/9 01/01/2022   Decreased Interest 0   Down, Depressed, Hopeless 0   PHQ - 2 Score 0   Altered sleeping 1   Tired, decreased energy 2   Change in appetite 1   Feeling bad or failure about yourself  0   Trouble concentrating 2   Moving slowly or fidgety/restless 2   Suicidal thoughts 0   PHQ-9 Score 8   Difficult doing work/chores Somewhat difficult      Interpretation of Total Score  Total Score Depression Severity:  1-4 = Minimal depression, 5-9 = Mild depression, 10-14 = Moderate depression, 15-19 = Moderately severe depression, 20-27 = Severe depression   Psychosocial Evaluation and Intervention:  Psychosocial Evaluation - 12/18/21 1601       Psychosocial Evaluation & Interventions   Interventions Encouraged to exercise with the program and follow exercise prescription;Stress management education    Comments Aoife is coming to pulmonary rehab for ILD. When she was referred in December her breathing was worse. Since then she has had cataract surgery which is why she had to delay starting. Her breathing has improved but she is still ready to get started in the program. She does report high stress levels related to her husband's diagnosis of lymphoma in 2021. They have spent a lot of time in the hospital but she reports his health is getting more stable. She does have a good support system of family  and her church.    Expected Outcomes Short: attend pulmonary rehab for education and exercise Long: develop and maintain positive self care habits.    Continue Psychosocial Services  Follow up required by staff             Psychosocial Re-Evaluation:   Psychosocial Discharge (Final Psychosocial Re-Evaluation):   Education: Education Goals: Education classes will be provided on a weekly basis, covering required topics. Participant will state understanding/return demonstration of topics presented.  Learning Barriers/Preferences:  Learning Barriers/Preferences - 12/18/21 1539       Learning Barriers/Preferences   Learning Barriers None    Learning Preferences None             General Pulmonary Education Topics:  Infection Prevention: - Provides  verbal and written material to individual with discussion of infection control including proper hand washing and proper equipment cleaning during exercise session. Flowsheet Row Pulmonary Rehab from 01/01/2022 in [facility] Cardiac and Pulmonary Rehab  Education need identified 01/01/22  Date 01/01/22  Educator [place]  Instruction Review Code 1- Verbalizes Understanding       Falls Prevention: - Provides verbal and written material to individual with discussion of falls prevention and safety. Flowsheet Row Pulmonary Rehab from 01/01/2022 in [facility] Cardiac and Pulmonary Rehab  Education need identified 01/01/22  Date 01/01/22  Educator [place]  Instruction Review Code 1- Verbalizes Understanding       Chronic Lung Disease Review: - Group verbal instruction with posters, models, PowerPoint presentations and videos,  to review new updates, new respiratory medications, new advancements in procedures and treatments. Providing information on websites and "800" numbers for continued self-education. Includes information about supplement oxygen, available portable oxygen systems, continuous and intermittent flow rates, oxygen safety, concentrators,  and Medicare reimbursement for oxygen. Explanation of Pulmonary Drugs, including class, frequency, complications, importance of spacers, rinsing mouth after steroid MDI's, and proper cleaning methods for nebulizers. Review of basic lung anatomy and physiology related to function, structure, and complications of lung disease. Review of risk factors. Discussion about methods for diagnosing sleep apnea and types of masks and machines for OSA. Includes a review of the use of types of environmental controls: home humidity, furnaces, filters, dust mite/pet prevention, HEPA vacuums. Discussion about weather changes, air quality and the benefits of nasal washing. Instruction on Warning signs, infection symptoms, calling MD promptly, preventive modes, and value of vaccinations. Review of effective airway clearance, coughing and/or vibration techniques. Emphasizing that all should Create an Action Plan. Written material given at graduation. Flowsheet Row Pulmonary Rehab from 01/01/2022 in [facility] Cardiac and Pulmonary Rehab  Education need identified 01/01/22       AED/CPR: - Group verbal and written instruction with the use of models to demonstrate the basic use of the AED with the basic ABC's of resuscitation.    Anatomy and Cardiac Procedures: - Group verbal and visual presentation and models provide information about basic cardiac anatomy and function. Reviews the testing methods done to diagnose heart disease and the outcomes of the test results. Describes the treatment choices: Medical Management, Angioplasty, or Coronary Bypass Surgery for treating various heart conditions including Myocardial Infarction, Angina, Valve Disease, and Cardiac Arrhythmias.  Written material given at graduation.   Medication Safety: - Group verbal and visual instruction to review commonly prescribed medications for heart and lung disease. Reviews the medication, class of the drug, and side effects. Includes the steps to  properly store meds and maintain the prescription regimen.  Written material given at graduation.   Other: -Provides group and verbal instruction on various topics (see comments)   Knowledge Questionnaire Score:  Knowledge Questionnaire Score - 01/01/22 1159       Knowledge Questionnaire Score   Pre Score 15/18: Oxygen              Core Components/Risk Factors/Patient Goals at Admission:  Personal Goals and Risk Factors at Admission - 01/01/22 1223       Core Components/Risk Factors/Patient Goals on Admission    Weight Management Yes;Weight Loss    Intervention Weight Management: Develop a combined nutrition and exercise program designed to reach desired caloric intake, while maintaining appropriate intake of nutrient and fiber, sodium and fats, and appropriate energy expenditure required for the weight goal.;Weight Management: Provide education and appropriate  resources to help participant work on and attain dietary goals.;Weight Management/Obesity: Establish reasonable short term and long term weight goals.    Admit Weight 166 lb (75.3 kg)    Goal Weight: Short Term 161 lb (73 kg)    Goal Weight: Long Term 156 lb (70.8 kg)    Expected Outcomes Long Term: Adherence to nutrition and physical activity/exercise program aimed toward attainment of established weight goal;Short Term: Continue to assess and modify interventions until short term weight is achieved;Weight Loss: Understanding of general recommendations for a balanced deficit meal plan, which promotes 1-2 lb weight loss per week and includes a negative energy balance of [phone] kcal/d;Understanding recommendations for meals to include 15-35% energy as protein, 25-35% energy from fat, 35-60% energy from carbohydrates, less than 200mg  of dietary cholesterol, 20-35 gm of total fiber daily;Understanding of distribution of calorie intake throughout the day with the consumption of 4-5 meals/snacks    Improve shortness of breath with  ADL's Yes    Intervention Provide education, individualized exercise plan and daily activity instruction to help decrease symptoms of SOB with activities of daily living.    Expected Outcomes Short Term: Improve cardiorespiratory fitness to achieve a reduction of symptoms when performing ADLs;Long Term: Be able to perform more ADLs without symptoms or delay the onset of symptoms    Increase knowledge of respiratory medications and ability to use respiratory devices properly  Yes    Intervention Provide education and demonstration as needed of appropriate use of medications, inhalers, and oxygen therapy.    Expected Outcomes Short Term: Achieves understanding of medications use. Understands that oxygen is a medication prescribed by physician. Demonstrates appropriate use of inhaler and oxygen therapy.;Long Term: Maintain appropriate use of medications, inhalers, and oxygen therapy.    Hypertension Yes    Intervention Provide education on lifestyle modifcations including regular physical activity/exercise, weight management, moderate sodium restriction and increased consumption of fresh fruit, vegetables, and low fat dairy, alcohol moderation, and smoking cessation.;Monitor prescription use compliance.    Expected Outcomes Short Term: Continued assessment and intervention until BP is < 140/70mm HG in hypertensive participants. < 130/73mm HG in hypertensive participants with diabetes, heart failure or chronic kidney disease.;Long Term: Maintenance of blood pressure at goal levels.             Education:Diabetes - Individual verbal and written instruction to review signs/symptoms of diabetes, desired ranges of glucose level fasting, after meals and with exercise. Acknowledge that pre and post exercise glucose checks will be done for 3 sessions at entry of program.   Know Your Numbers and Heart Failure: - Group verbal and visual instruction to discuss disease risk factors for cardiac and pulmonary  disease and treatment options.  Reviews associated critical values for Overweight/Obesity, Hypertension, Cholesterol, and Diabetes.  Discusses basics of heart failure: signs/symptoms and treatments.  Introduces Heart Failure Zone chart for action plan for heart failure.  Written material given at graduation.   Core Components/Risk Factors/Patient Goals Review:    Core Components/Risk Factors/Patient Goals at Discharge (Final Review):    ITP Comments:  ITP Comments     Row Name 12/18/21 1549 01/01/22 1158         ITP Comments Initial telephone orientation completed. Diagnosis can be found in [facility] 12/19. EP orientation scheduled for Wednesday 2/22 at 10:30am. Completed 6MWT and gym orientation. Initial ITP created and sent for review to Dr. Emily Filbert, Medical Director.  Comments: Initial ITP

## 2022-01-15 ENCOUNTER — Other Ambulatory Visit: Payer: Self-pay

## 2022-01-15 ENCOUNTER — Encounter: Payer: Medicare Other | Attending: Internal Medicine

## 2022-01-15 DIAGNOSIS — J849 Interstitial pulmonary disease, unspecified: Secondary | ICD-10-CM | POA: Diagnosis not present

## 2022-01-15 NOTE — Progress Notes (Signed)
Daily Session Note ? ?Patient Details  ?Name: Tammy Kelley ?MRN: [id number] ?Date of Birth: 1945-11-21 ?Referring Provider:   ?Flowsheet Row Pulmonary Rehab from 01/01/2022 in [facility] Cardiac and Pulmonary Rehab  ?Referring Provider Deon Pilling MD  ? ?  ? ? ?Encounter Date: 01/15/2022 ? ?Check In: ? Session Check In - 01/15/22 1340   ? ?  ? Check-In  ? Supervising physician immediately available to respond to emergencies See telemetry face sheet for immediately available ER MD   ? Location [facility]-Cardiac & Pulmonary Rehab   ? Staff Present Birdie Sons, MPA, Nino Glow, MS, ASCM CEP, Exercise Physiologist;Joseph [place], [place]   ? Virtual Visit No   ? Medication changes reported     No   ? Fall or balance concerns reported    No   ? Warm-up and Cool-down Performed on first and last piece of equipment   ? Resistance Training Performed Yes   ? VAD Patient? No   ? PAD/SET Patient? No   ?  ? Pain Assessment  ? Currently in Pain? No/denies   ? ?  ?  ? ?  ? ? ? ? ? ?Social History  ? ?Tobacco Use  ?Smoking Status Former  ? Types: Cigarettes  ? Quit date: 2004  ? Years since quitting: 19.1  ?Smokeless Tobacco Never  ? ? ?Goals Met:  ?Independence with exercise equipment ?Exercise tolerated well ?No report of concerns or symptoms today ?Strength training completed today ? ?Goals Unmet:  ?Not Applicable ? ?Comments: First full day of exercise!  Patient was oriented to gym and equipment including functions, settings, policies, and procedures.  Patient's individual exercise prescription and treatment plan were reviewed.  All starting workloads were established based on the results of the 6 minute walk test done at initial orientation visit.  The plan for exercise progression was also introduced and progression will be customized based on patient's performance and goals. ? ? ? ?Dr. Emily Filbert is Medical Director for [place].  ?Dr. Ottie Glazier is Medical Director for [facility] Pulmonary  Rehabilitation. ?

## 2022-01-20 ENCOUNTER — Other Ambulatory Visit: Payer: Self-pay

## 2022-01-20 DIAGNOSIS — J849 Interstitial pulmonary disease, unspecified: Secondary | ICD-10-CM | POA: Diagnosis not present

## 2022-01-20 NOTE — Progress Notes (Signed)
Daily Session Note ? ?Patient Details  ?Name: Tammy Kelley ?MRN: [id number] ?Date of Birth: October 30, 1946 ?Referring Provider:   ?Flowsheet Row Pulmonary Rehab from 01/01/2022 in [facility] Cardiac and Pulmonary Rehab  ?Referring Provider Deon Pilling MD  ? ?  ? ? ?Encounter Date: 01/20/2022 ? ?Check In: ? Session Check In - 01/20/22 1333   ? ?  ? Check-In  ? Supervising physician immediately available to respond to emergencies See telemetry face sheet for immediately available ER MD   ? Location [facility]-Cardiac & Pulmonary Rehab   ? Staff Present Birdie Sons, MPA, RN;Joseph [place], RCP,RRT,BSRT;Kara Campbell, MS, ASCM CEP, Exercise Physiologist   ? Virtual Visit No   ? Medication changes reported     No   ? Fall or balance concerns reported    No   ? Warm-up and Cool-down Performed on first and last piece of equipment   ? Resistance Training Performed Yes   ? VAD Patient? No   ? PAD/SET Patient? No   ?  ? Pain Assessment  ? Currently in Pain? No/denies   ? ?  ?  ? ?  ? ? ? ? ? ?Social History  ? ?Tobacco Use  ?Smoking Status Former  ? Types: Cigarettes  ? Quit date: 2004  ? Years since quitting: 19.2  ?Smokeless Tobacco Never  ? ? ?Goals Met:  ?Independence with exercise equipment ?Exercise tolerated well ?Personal goals reviewed ?No report of concerns or symptoms today ?Strength training completed today ? ?Goals Unmet:  ?Not Applicable ? ?Comments: Pt able to follow exercise prescription today without complaint.  Will continue to monitor for progression. ? ? ? ?Dr. Emily Filbert is Medical Director for [place].  ?Dr. Ottie Glazier is Medical Director for [facility] Pulmonary Rehabilitation. ?

## 2022-01-22 ENCOUNTER — Other Ambulatory Visit: Payer: Self-pay

## 2022-01-22 DIAGNOSIS — J849 Interstitial pulmonary disease, unspecified: Secondary | ICD-10-CM | POA: Diagnosis not present

## 2022-01-22 NOTE — Progress Notes (Signed)
Daily Session Note ? ?Patient Details  ?Name: Tammy Kelley ?MRN: [id number] ?Date of Birth: February 10, 1946 ?Referring Provider:   ?Flowsheet Row Pulmonary Rehab from 01/01/2022 in [facility] Cardiac and Pulmonary Rehab  ?Referring Provider Deon Pilling MD  ? ?  ? ? ?Encounter Date: 01/22/2022 ? ?Check In: ? Session Check In - 01/22/22 1346   ? ?  ? Check-In  ? Supervising physician immediately available to respond to emergencies See telemetry face sheet for immediately available ER MD   ? Location [facility]-Cardiac & Pulmonary Rehab   ? Staff Present Birdie Sons, MPA, RN;Joseph [place], RCP,RRT,BSRT;Laureen Owens Shark, BS, RRT, CPFT;Melissa [place], RDN, LDN;Jessica Hawkins, MA, RCEP, CCRP, CCET   ? Virtual Visit No   ? Medication changes reported     No   ? Fall or balance concerns reported    No   ? Warm-up and Cool-down Performed on first and last piece of equipment   ? Resistance Training Performed Yes   ? VAD Patient? No   ? PAD/SET Patient? No   ?  ? Pain Assessment  ? Currently in Pain? No/denies   ? ?  ?  ? ?  ? ? ? ? ? ?Social History  ? ?Tobacco Use  ?Smoking Status Former  ? Types: Cigarettes  ? Quit date: 2004  ? Years since quitting: 19.2  ?Smokeless Tobacco Never  ? ? ?Goals Met:  ?Independence with exercise equipment ?Exercise tolerated well ?No report of concerns or symptoms today ?Strength training completed today ? ?Goals Unmet:  ?Not Applicable ? ?Comments: Pt able to follow exercise prescription today without complaint.  Will continue to monitor for progression. ? ? ? ?Dr. Emily Filbert is Medical Director for [place].  ?Dr. Ottie Glazier is Medical Director for [facility] Pulmonary Rehabilitation. ?

## 2022-01-27 ENCOUNTER — Other Ambulatory Visit: Payer: Self-pay

## 2022-01-27 DIAGNOSIS — J849 Interstitial pulmonary disease, unspecified: Secondary | ICD-10-CM

## 2022-01-27 NOTE — Progress Notes (Signed)
Daily Session Note ? ?Patient Details  ?Name: Tammy Kelley ?MRN: [id number] ?Date of Birth: December 06, 1945 ?Referring Provider:   ?Flowsheet Row Pulmonary Rehab from 01/01/2022 in [facility] Cardiac and Pulmonary Rehab  ?Referring Provider Deon Pilling MD  ? ?  ? ? ?Encounter Date: 01/27/2022 ? ?Check In: ? Session Check In - 01/27/22 1332   ? ?  ? Check-In  ? Supervising physician immediately available to respond to emergencies See telemetry face sheet for immediately available ER MD   ? Location [facility]-Cardiac & Pulmonary Rehab   ? Staff Present Birdie Sons, MPA, Nino Glow, MS, ASCM CEP, Exercise Physiologist;Joseph [place], [place]   ? Virtual Visit No   ? Medication changes reported     No   ? Fall or balance concerns reported    No   ? Warm-up and Cool-down Performed on first and last piece of equipment   ? Resistance Training Performed Yes   ? VAD Patient? No   ? PAD/SET Patient? No   ?  ? Pain Assessment  ? Currently in Pain? No/denies   ? ?  ?  ? ?  ? ? ? ? ? ?Social History  ? ?Tobacco Use  ?Smoking Status Former  ? Types: Cigarettes  ? Quit date: 2004  ? Years since quitting: 19.2  ?Smokeless Tobacco Never  ? ? ?Goals Met:  ?Independence with exercise equipment ?Exercise tolerated well ?No report of concerns or symptoms today ?Strength training completed today ? ?Goals Unmet:  ?Not Applicable ? ?Comments: Pt able to follow exercise prescription today without complaint.  Will continue to monitor for progression. ? ? ? ?Dr. Emily Filbert is Medical Director for [place].  ?Dr. Ottie Glazier is Medical Director for [facility] Pulmonary Rehabilitation. ?

## 2022-01-29 ENCOUNTER — Other Ambulatory Visit: Payer: Self-pay

## 2022-01-29 ENCOUNTER — Encounter: Payer: Self-pay | Admitting: *Deleted

## 2022-01-29 DIAGNOSIS — J849 Interstitial pulmonary disease, unspecified: Secondary | ICD-10-CM | POA: Diagnosis not present

## 2022-01-29 NOTE — Progress Notes (Signed)
Pulmonary Individual Treatment Plan ? ?Patient Details  ?Name: Tammy Kelley ?MRN: [id number] ?Date of Birth: June 23, 1946 ?Referring Provider:   ?Flowsheet Row Pulmonary Rehab from 01/01/2022 in [facility] Cardiac and Pulmonary Rehab  ?Referring Provider Deon Pilling MD  ? ?  ? ? ?Initial Encounter Date:  ?Flowsheet Row Pulmonary Rehab from 01/01/2022 in [facility] Cardiac and Pulmonary Rehab  ?Date 01/01/22  ? ?  ? ? ?Visit Diagnosis: ILD (interstitial lung disease) ([place]) ? ?Patient's Home Medications on Admission: ? ?Current Outpatient Medications:  ?  albuterol (VENTOLIN HFA) 108 (90 Base) MCG/ACT inhaler, Inhale into the lungs., Disp: , Rfl:  ?  amoxicillin (AMOXIL) 500 MG capsule, TK FOUR CS PO 1 HOUR B DAPP, Disp: , Rfl:  ?  Calcium Carbonate-Vitamin D 600-400 MG-UNIT tablet, Take 1 tablet by mouth daily., Disp: , Rfl:  ?  chlorthalidone (HYGROTON) 25 MG tablet, TAKE 1/2 TABLET(12.5 MG) BY MOUTH EVERY MORNING, Disp: , Rfl:  ?  codeine 30 MG tablet, Take by mouth., Disp: , Rfl:  ?  ergocalciferol (VITAMIN D2) 1.25 MG (50000 UT) capsule, Take by mouth., Disp: , Rfl:  ?  famotidine (PEPCID) 40 MG tablet, famotidine 40 mg tablet  TAKE 1 TABLET BY MOUTH IN THE EVENING, Disp: , Rfl:  ?  fluticasone (FLONASE) 50 MCG/ACT nasal spray, SHAKE LIQUID AND USE 1 SPRAY IN EACH NOSTRIL DAILY, Disp: , Rfl:  ?  folic acid (FOLVITE) 1 MG tablet, Take 1 mg by mouth daily. (Patient not taking: Reported on 12/18/2021), Disp: , Rfl:  ?  ipratropium (ATROVENT) 0.06 % nasal spray, 2 sprays into each nostril Three (3) times a day., Disp: , Rfl:  ?  meloxicam (MOBIC) 15 MG tablet, Take 15 mg by mouth daily., Disp: , Rfl:  ?  metoprolol succinate (TOPROL-XL) 25 MG 24 hr tablet, every evening, Disp: , Rfl:  ?  mycophenolate (CELLCEPT) 500 MG tablet, Take by mouth., Disp: , Rfl:  ?  predniSONE (DELTASONE) 2.5 MG tablet, TAKE 3 TABLETS(7.5 MG) BY MOUTH DAILY, Disp: , Rfl:  ?  ranitidine (ZANTAC) 300 MG tablet, , Disp: , Rfl:  ?  sulfamethoxazole-trimethoprim  (BACTRIM) 400-80 MG tablet, TAKE 1 TABLET BY MOUTH EVERY MONDAY, WEDNESDAY, FRIDAY AT 4:00 PM, Disp: , Rfl:  ?  traZODone (DESYREL) 100 MG tablet, Take by mouth., Disp: , Rfl:  ? ?Past Medical History: ?No past medical history on file. ? ?Tobacco Use: ?Social History  ? ?Tobacco Use  ?Smoking Status Former  ? Types: Cigarettes  ? Quit date: 2004  ? Years since quitting: 19.2  ?Smokeless Tobacco Never  ? ? ?Labs: ?Review Flowsheet   ? ?    ? View : No data to display.  ?  ?  ?  ?  ?  ? ? ? ?Pulmonary Assessment Scores: ? Pulmonary Assessment Scores   ? ? [place] Name 01/01/22 1159  ?  ?  ?  ? ADL UCSD  ? ADL Phase Entry    ? SOB Score total 52    ? Rest 0    ? Walk 2    ? Stairs 4    ? Bath 2    ? Dress 2    ? Shop 2    ?  ? CAT Score  ? CAT Score 11    ?  ? mMRC Score  ? mMRC Score 2    ? ?  ?  ? ?  ?  ?UCSD: ?Self-administered rating of dyspnea associated with activities of daily living (  ADLs) ?6-point scale (0 = "not at all" to 5 = "maximal or unable to do because of breathlessness")  ?Scoring Scores range from 0 to 120.  Minimally important difference is 5 units ? ?CAT: ?CAT can identify the health impairment of COPD patients and is better correlated with disease progression.  ?CAT has a scoring range of zero to 40. The CAT score is classified into four groups of low (less than 10), medium (10 - 20), high (21-30) and very high (31-40) based on the impact level of disease on health status. A CAT score over 10 suggests significant symptoms.  A worsening CAT score could be explained by an exacerbation, poor medication adherence, poor inhaler technique, or progression of COPD or comorbid conditions.  ?CAT MCID is 2 points ? ?mMRC: ?mMRC (Modified Medical Research Council) Dyspnea Scale is used to assess the degree of baseline functional disability in patients of respiratory disease due to dyspnea. ?No minimal important difference is established. A decrease in score of 1 point or greater is considered a positive change.   ? ?Pulmonary Function Assessment: ? ? ?Exercise Target Goals: ?Exercise Program Goal: ?Individual exercise prescription set using results from initial 6 min walk test and THRR while considering  patient?s activity barriers and safety.  ? ?Exercise Prescription Goal: ?Initial exercise prescription builds to 30-45 minutes a day of aerobic activity, 2-3 days per week.  Home exercise guidelines will be given to patient during program as part of exercise prescription that the participant will acknowledge. ? ?Education: Aerobic Exercise: ?- Group verbal and visual presentation on the components of exercise prescription. Introduces F.I.T.T principle from ACSM for exercise prescriptions.  Reviews F.I.T.T. principles of aerobic exercise including progression. Written material given at graduation. ? ? ?Education: Resistance Exercise: ?- Group verbal and visual presentation on the components of exercise prescription. Introduces F.I.T.T principle from ACSM for exercise prescriptions  Reviews F.I.T.T. principles of resistance exercise including progression. Written material given at graduation. ?Flowsheet Row Pulmonary Rehab from 01/15/2022 in [facility] Cardiac and Pulmonary Rehab  ?Date 01/15/22  ?Educator AS  ?Instruction Review Code 1- Verbalizes Understanding  ? ?  ? ?  ?Education: Exercise & Equipment Safety: ?- Individual verbal instruction and demonstration of equipment use and safety with use of the equipment. ?Flowsheet Row Pulmonary Rehab from 01/15/2022 in [facility] Cardiac and Pulmonary Rehab  ?Education need identified 01/01/22  ?Date 01/01/22  ?Educator KL  ?Instruction Review Code 1- Verbalizes Understanding  ? ?  ? ? ?Education: Exercise Physiology & General Exercise Guidelines: ?- Group verbal and written instruction with models to review the exercise physiology of the cardiovascular system and associated critical values. Provides general exercise guidelines with specific guidelines to those with heart or lung disease.   ? ? ?Education: Flexibility, Balance, Mind/Body Relaxation: ?- Group verbal and visual presentation with interactive activity on the components of exercise prescription. Introduces F.I.T.T principle from ACSM for exercise prescriptions. Reviews F.I.T.T. principles of flexibility and balance exercise training including progression. Also discusses the mind body connection.  Reviews various relaxation techniques to help reduce and manage stress (i.e. Deep breathing, progressive muscle relaxation, and visualization). Balance handout provided to take home. Written material given at graduation. ? ? ?Activity Barriers & Risk Stratification: ? Activity Barriers & Cardiac Risk Stratification - 01/01/22 1218   ? ?  ? Activity Barriers & Cardiac Risk Stratification  ? Activity Barriers Shortness of Breath;Other (comment);Deconditioning   ? 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 ?  ?  ? ?  ? ? ?  6 Minute Walk: ? 6 Minute Walk   ? ? [place] Name 01/01/22 1257  ?  ?  ?  ? 6 Minute Walk  ? Phase Initial    ? Distance 905 feet    ? Walk Time 6 minutes    ? # of Rest Breaks 0    ? MPH 1.71    ? METS 1.66    ? RPE 8    ? Perceived Dyspnea  1    ? VO2 Peak 5.81    ? Symptoms Yes (comment)    ? Comments SOB    ? Resting HR 69 bpm    ? Resting BP 126/70    ? Resting Oxygen Saturation  95 %    ? Exercise Oxygen Saturation  during 6 min walk 88 %    ? Max Ex. HR 89 bpm    ? Max Ex. BP 142/72    ? 2 Minute Post BP 122/70    ?  ? Interval HR  ? 1 Minute HR 81    ? 2 Minute HR 85    ? 3 Minute HR 86    ? 4 Minute HR 87    ? 5 Minute HR 89    ? 6 Minute HR 89    ? 2 Minute Post HR 71    ? Interval Heart Rate? Yes    ?  ? Interval Oxygen  ? Interval Oxygen? Yes    ? Baseline Oxygen Saturation % 95 %    ? 1 Minute Oxygen Saturation % 92 %    ? 1 Minute Liters of Oxygen 0 L  RA    ? 2 Minute Oxygen Saturation % 89 %    ? 2 Minute Liters of Oxygen 0 L    ? 3 Minute Oxygen Saturation % 88 %    ? 3 Minute Liters of Oxygen 0 L    ? 4 Minute Oxygen  Saturation % 89 %    ? 4 Minute Liters of Oxygen 0 L    ? 5 Minute Oxygen Saturation % 88 %    ? 5 Minute Liters of Oxygen 0 L    ? 6 Minute Oxygen Saturation % 89 %    ? 6 Minute Liters of Oxygen 0 L    ?

## 2022-01-29 NOTE — Progress Notes (Signed)
Daily Session Note ? ?Patient Details  ?Name: Tammy Kelley ?MRN: [id number] ?Date of Birth: 09-Nov-1946 ?Referring Provider:   ?Flowsheet Row Pulmonary Rehab from 01/01/2022 in [facility] Cardiac and Pulmonary Rehab  ?Referring Provider Deon Pilling MD  ? ?  ? ? ?Encounter Date: 01/29/2022 ? ?Check In: ? Session Check In - 01/29/22 1328   ? ?  ? Check-In  ? Supervising physician immediately available to respond to emergencies See telemetry face sheet for immediately available ER MD   ? Location [facility]-Cardiac & Pulmonary Rehab   ? Staff Present Birdie Sons, MPA, Nino Glow, MS, ASCM CEP, Exercise Physiologist;Joseph [place], [place]   ? Virtual Visit No   ? Medication changes reported     No   ? Fall or balance concerns reported    No   ? Warm-up and Cool-down Performed on first and last piece of equipment   ? Resistance Training Performed Yes   ? VAD Patient? No   ? PAD/SET Patient? No   ?  ? Pain Assessment  ? Currently in Pain? No/denies   ? ?  ?  ? ?  ? ? ? ? ? ?Social History  ? ?Tobacco Use  ?Smoking Status Former  ? Types: Cigarettes  ? Quit date: 2004  ? Years since quitting: 19.2  ?Smokeless Tobacco Never  ? ? ?Goals Met:  ?Independence with exercise equipment ?Exercise tolerated well ?No report of concerns or symptoms today ?Strength training completed today ? ?Goals Unmet:  ?Not Applicable ? ?Comments: Pt able to follow exercise prescription today without complaint.  Will continue to monitor for progression. ? ? ? ?Dr. Emily Filbert is Medical Director for [place].  ?Dr. Ottie Glazier is Medical Director for [facility] Pulmonary Rehabilitation. ?

## 2022-01-30 ENCOUNTER — Other Ambulatory Visit: Payer: Self-pay

## 2022-01-30 DIAGNOSIS — J849 Interstitial pulmonary disease, unspecified: Secondary | ICD-10-CM

## 2022-01-30 NOTE — Progress Notes (Signed)
Completed initial RD consultation ?

## 2022-02-03 ENCOUNTER — Other Ambulatory Visit: Payer: Self-pay

## 2022-02-03 DIAGNOSIS — J849 Interstitial pulmonary disease, unspecified: Secondary | ICD-10-CM | POA: Diagnosis not present

## 2022-02-03 NOTE — Progress Notes (Signed)
Daily Session Note ? ?Patient Details  ?Name: Tammy Kelley ?MRN: [id number] ?Date of Birth: 1946/04/07 ?Referring Provider:   ?Flowsheet Row Pulmonary Rehab from 01/01/2022 in [facility] Cardiac and Pulmonary Rehab  ?Referring Provider Deon Pilling MD  ? ?  ? ? ?Encounter Date: 02/03/2022 ? ?Check In: ? Session Check In - 02/03/22 1333   ? ?  ? Check-In  ? Supervising physician immediately available to respond to emergencies See telemetry face sheet for immediately available ER MD   ? Location [facility]-Cardiac & Pulmonary Rehab   ? Staff Present Birdie Sons, MPA, Nino Glow, MS, ASCM CEP, Exercise Physiologist;Joseph [place], [place]   ? Virtual Visit No   ? Medication changes reported     No   ? Fall or balance concerns reported    No   ? Warm-up and Cool-down Performed on first and last piece of equipment   ? Resistance Training Performed Yes   ? VAD Patient? No   ? PAD/SET Patient? No   ?  ? Pain Assessment  ? Currently in Pain? No/denies   ? ?  ?  ? ?  ? ? ? ? ? ?Social History  ? ?Tobacco Use  ?Smoking Status Former  ? Types: Cigarettes  ? Quit date: 2004  ? Years since quitting: 19.2  ?Smokeless Tobacco Never  ? ? ?Goals Met:  ?Independence with exercise equipment ?Exercise tolerated well ?No report of concerns or symptoms today ?Strength training completed today ? ?Goals Unmet:  ?Not Applicable ? ?Comments: Pt able to follow exercise prescription today without complaint.  Will continue to monitor for progression. ? ? ? ?Dr. Emily Filbert is Medical Director for [place].  ?Dr. Ottie Glazier is Medical Director for [facility] Pulmonary Rehabilitation. ?

## 2022-02-05 DIAGNOSIS — J849 Interstitial pulmonary disease, unspecified: Secondary | ICD-10-CM | POA: Diagnosis not present

## 2022-02-05 NOTE — Progress Notes (Signed)
Daily Session Note ? ?Patient Details  ?Name: Tammy Kelley ?MRN: [id number] ?Date of Birth: May 12, 1946 ?Referring Provider:   ?Flowsheet Row Pulmonary Rehab from 01/01/2022 in [facility] Cardiac and Pulmonary Rehab  ?Referring Provider Deon Pilling MD  ? ?  ? ? ?Encounter Date: 02/05/2022 ? ?Check In: ? Session Check In - 02/05/22 1332   ? ?  ? Check-In  ? Supervising physician immediately available to respond to emergencies See telemetry face sheet for immediately available ER MD   ? Location [facility]-Cardiac & Pulmonary Rehab   ? Staff Present Birdie Sons, MPA, RN;Joseph [place], RCP,RRT,BSRT;Kara [place], MS, ASCM CEP, Exercise Physiologist   ? Virtual Visit No   ? Medication changes reported     No   ? Fall or balance concerns reported    No   ? Warm-up and Cool-down Performed on first and last piece of equipment   ? Resistance Training Performed Yes   ? VAD Patient? No   ? PAD/SET Patient? No   ?  ? Pain Assessment  ? Currently in Pain? No/denies   ? ?  ?  ? ?  ? ? ? ? ? ?Social History  ? ?Tobacco Use  ?Smoking Status Former  ? Types: Cigarettes  ? Quit date: 2004  ? Years since quitting: 19.2  ?Smokeless Tobacco Never  ? ? ?Goals Met:  ?Independence with exercise equipment ?Exercise tolerated well ?No report of concerns or symptoms today ?Strength training completed today ? ?Goals Unmet:  ?Not Applicable ? ?Comments: Pt able to follow exercise prescription today without complaint.  Will continue to monitor for progression. ? ? ? ?Dr. Emily Filbert is Medical Director for [place].  ?Dr. Ottie Glazier is Medical Director for [facility] Pulmonary Rehabilitation. ?

## 2022-02-10 ENCOUNTER — Encounter: Payer: Medicare Other | Attending: Internal Medicine

## 2022-02-10 DIAGNOSIS — R0602 Shortness of breath: Secondary | ICD-10-CM | POA: Insufficient documentation

## 2022-02-10 DIAGNOSIS — J849 Interstitial pulmonary disease, unspecified: Secondary | ICD-10-CM | POA: Insufficient documentation

## 2022-02-10 NOTE — Progress Notes (Signed)
Daily Session Note ? ?Patient Details  ?Name: Tammy Kelley ?MRN: [id number] ?Date of Birth: 06/13/1946 ?Referring Provider:   ?Flowsheet Row Pulmonary Rehab from 01/01/2022 in [facility] Cardiac and Pulmonary Rehab  ?Referring Provider Deon Pilling MD  ? ?  ? ? ?Encounter Date: 02/10/2022 ? ?Check In: ? Session Check In - 02/10/22 1337   ? ?  ? Check-In  ? Supervising physician immediately available to respond to emergencies See telemetry face sheet for immediately available ER MD   ? Location [facility]Cardiac & Pulmonary Rehab   ? Staff Present Birdie Sons, MPA, RN;Joseph [place], RCP,RRT,BSRT;Kara [place], MS, ASCM CEP, Exercise Physiologist   ? Virtual Visit No   ? Medication changes reported     No   ? Fall or balance concerns reported    No   ? Warm-up and Cool-down Performed on first and last piece of equipment   ? Resistance Training Performed Yes   ? VAD Patient? No   ? PAD/SET Patient? No   ?  ? Pain Assessment  ? Currently in Pain? No/denies   ? ?  ?  ? ?  ? ? ? ? ? ?Social History  ? ?Tobacco Use  ?Smoking Status Former  ? Types: Cigarettes  ? Quit date: 2004  ? Years since quitting: 19.2  ?Smokeless Tobacco Never  ? ? ?Goals Met:  ?Independence with exercise equipment ?Exercise tolerated well ?No report of concerns or symptoms today ?Strength training completed today ? ?Goals Unmet:  ?Not Applicable ? ?Comments: Pt able to follow exercise prescription today without complaint.  Will continue to monitor for progression. ? ? ? ?Dr. Emily Filbert is Medical Director for [place].  ?Dr. Ottie Glazier is Medical Director for [facility] Pulmonary Rehabilitation. ?

## 2022-02-12 DIAGNOSIS — J849 Interstitial pulmonary disease, unspecified: Secondary | ICD-10-CM

## 2022-02-12 NOTE — Progress Notes (Signed)
Daily Session Note ? ?Patient Details  ?Name: Tammy Kelley ?MRN: [id number] ?Date of Birth: 01/02/1946 ?Referring Provider:   ?Flowsheet Row Pulmonary Rehab from 01/01/2022 in [facility] Cardiac and Pulmonary Rehab  ?Referring Provider Lobo, Leonard MD  ? ?  ? ? ?Encounter Date: 02/12/2022 ? ?Check In: ? Session Check In - 02/12/22 1338   ? ?  ? Check-In  ? Supervising physician immediately available to respond to emergencies See telemetry face sheet for immediately available ER MD   ? Location [facility]-Cardiac & Pulmonary Rehab   ? Staff Present Kelly Bollinger, MPA, RN;Kara [place], MS, ASCM CEP, Exercise Physiologist;Jessica Hawkins, MA, RCEP, CCRP, CCET   ? Virtual Visit No   ? Medication changes reported     No   ? Fall or balance concerns reported    No   ? Warm-up and Cool-down Performed on first and last piece of equipment   ? Resistance Training Performed Yes   ? VAD Patient? No   ? PAD/SET Patient? No   ?  ? Pain Assessment  ? Currently in Pain? No/denies   ? ?  ?  ? ?  ? ? ? ? ? ?Social History  ? ?Tobacco Use  ?Smoking Status Former  ? Types: Cigarettes  ? Quit date: 2004  ? Years since quitting: 19.2  ?Smokeless Tobacco Never  ? ? ?Goals Met:  ?Independence with exercise equipment ?Exercise tolerated well ?No report of concerns or symptoms today ?Strength training completed today ? ?Goals Unmet:  ?Not Applicable ? ?Comments: Pt able to follow exercise prescription today without complaint.  Will continue to monitor for progression. ? ? ? ?Dr. Mark Miller is Medical Director for [facility] Cardiac [facility].  ?Dr. Fuad Aleskerov is Medical Director for [facility] Pulmonary Rehabilitation. ?

## 2022-02-19 ENCOUNTER — Encounter: Payer: Medicare Other | Admitting: *Deleted

## 2022-02-19 DIAGNOSIS — J849 Interstitial pulmonary disease, unspecified: Secondary | ICD-10-CM

## 2022-02-19 NOTE — Progress Notes (Signed)
Daily Session Note ? ?Patient Details  ?Name: Tammy Kelley ?MRN: [id number] ?Date of Birth: 27-Jun-1946 ?Referring Provider:   ?Flowsheet Row Pulmonary Rehab from 01/01/2022 in [facility] Cardiac and Pulmonary Rehab  ?Referring Provider Deon Pilling MD  ? ?  ? ? ?Encounter Date: 02/19/2022 ? ?Check In: ? Session Check In - 02/19/22 1342   ? ?  ? Check-In  ? Supervising physician immediately available to respond to emergencies See telemetry face sheet for immediately available ER MD   ? Location [facility]-Cardiac & Pulmonary Rehab   ? Staff Present Nyoka Cowden, RN, BSN, Ardeth Sportsman, RDN, Tawanna Solo, MS, ASCM CEP, Exercise Physiologist   ? Virtual Visit No   ? Medication changes reported     No   ? Fall or balance concerns reported    No   ? Tobacco Cessation No Change   ? Warm-up and Cool-down Performed on first and last piece of equipment   ? Resistance Training Performed Yes   ? VAD Patient? No   ? PAD/SET Patient? No   ?  ? Pain Assessment  ? Currently in Pain? No/denies   ? Multiple Pain Sites No   ? ?  ?  ? ?  ? ? ? ? ? ?Social History  ? ?Tobacco Use  ?Smoking Status Former  ? Types: Cigarettes  ? Quit date: 2004  ? Years since quitting: 19.2  ?Smokeless Tobacco Never  ? ? ?Goals Met:  ?Independence with exercise equipment ?Exercise tolerated well ?No report of concerns or symptoms today ? ?Goals Unmet:  ?Not Applicable ? ?Comments: Pt able to follow exercise prescription today without complaint.  Will continue to monitor for progression.  ? ? ?Dr. Emily Filbert is Medical Director for [place].  ?Dr. Ottie Glazier is Medical Director for [facility] Pulmonary Rehabilitation. ?

## 2022-02-24 DIAGNOSIS — J849 Interstitial pulmonary disease, unspecified: Secondary | ICD-10-CM

## 2022-02-24 NOTE — Progress Notes (Signed)
Daily Session Note ? ?Patient Details  ?Name: Tammy Kelley ?MRN: [id number] ?Date of Birth: [birth year] ?Referring Provider:   ?Flowsheet Row Pulmonary Rehab from 01/01/2022 in [facility] Cardiac and Pulmonary Rehab  ?Referring Provider Deon Pilling MD  ? ?  ? ? ?Encounter Date: 02/24/2022 ? ?Check In: ? Session Check In - 02/24/22 1331   ? ?  ? Check-In  ? Supervising physician immediately available to respond to emergencies See telemetry face sheet for immediately available ER MD   ? Location [facility]-Cardiac & Pulmonary Rehab   ? Staff Present Birdie Sons, MPA, RN;Joseph [place], RCP,RRT,BSRT;Kara [place], MS, ASCM CEP, Exercise Physiologist   ? Virtual Visit No   ? Medication changes reported     No   ? Fall or balance concerns reported    No   ? Tobacco Cessation No Change   ? Warm-up and Cool-down Performed on first and last piece of equipment   ? Resistance Training Performed Yes   ? VAD Patient? No   ? PAD/SET Patient? No   ?  ? Pain Assessment  ? Currently in Pain? No/denies   ? ?  ?  ? ?  ? ? ? ? ? ?Social History  ? ?Tobacco Use  ?Smoking Status Former  ? Types: Cigarettes  ? Quit date: 2004  ? Years since quitting: 19.3  ?Smokeless Tobacco Never  ? ? ?Goals Met:  ?Independence with exercise equipment ?Exercise tolerated well ?No report of concerns or symptoms today ?Strength training completed today ? ?Goals Unmet:  ?Not Applicable ? ?Comments: Pt able to follow exercise prescription today without complaint.  Will continue to monitor for progression. ? ? ? ?Dr. Emily Filbert is Medical Director for [place].  ?Dr. Ottie Glazier is Medical Director for [facility] Pulmonary Rehabilitation. ?

## 2022-03-03 ENCOUNTER — Telehealth: Payer: Self-pay

## 2022-03-03 DIAGNOSIS — J849 Interstitial pulmonary disease, unspecified: Secondary | ICD-10-CM

## 2022-03-03 NOTE — Telephone Encounter (Signed)
Patient called to let us know her husband passed away and will be out for a couple weeks. Told her to take whatever time she needs and let us know in a couple weeks what she will decide to do with rehab. ?

## 2022-03-17 ENCOUNTER — Encounter: Payer: Medicare Other | Attending: Internal Medicine

## 2022-03-17 DIAGNOSIS — J849 Interstitial pulmonary disease, unspecified: Secondary | ICD-10-CM | POA: Insufficient documentation

## 2022-03-17 DIAGNOSIS — R0602 Shortness of breath: Secondary | ICD-10-CM | POA: Insufficient documentation

## 2022-03-17 NOTE — Progress Notes (Signed)
Daily Session Note ? ?Patient Details  ?Name: Tammy Kelley ?MRN: [id number] ?Date of Birth: [birth year] ?Referring Provider:   ?Flowsheet Row Pulmonary Rehab from 01/01/2022 in [facility] Cardiac and Pulmonary Rehab  ?Referring Provider Deon Pilling MD  ? ?  ? ? ?Encounter Date: 03/17/2022 ? ?Check In: ? Session Check In - 03/17/22 1347   ? ?  ? Check-In  ? Supervising physician immediately available to respond to emergencies See telemetry face sheet for immediately available ER MD   ? Location [facility]-Cardiac & Pulmonary Rehab   ? Staff Present Birdie Sons, MPA, RN;Melissa [place], RDN, LDN;Jessica Colbert, MA, RCEP, CCRP, Marylynn Pearson, MS, ASCM CEP, Exercise Physiologist   ? Virtual Visit No   ? Medication changes reported     No   ? Fall or balance concerns reported    No   ? Tobacco Cessation No Change   ? Warm-up and Cool-down Performed on first and last piece of equipment   ? Resistance Training Performed Yes   ? VAD Patient? No   ? PAD/SET Patient? No   ?  ? Pain Assessment  ? Currently in Pain? No/denies   ? ?  ?  ? ?  ? ? ? ? ? ?Social History  ? ?Tobacco Use  ?Smoking Status Former  ? Types: Cigarettes  ? Quit date: 2004  ? Years since quitting: 19.3  ?Smokeless Tobacco Never  ? ? ?Goals Met:  ?Independence with exercise equipment ?Exercise tolerated well ?No report of concerns or symptoms today ?Strength training completed today ? ?Goals Unmet:  ?Not Applicable ? ?Comments: Pt able to follow exercise prescription today without complaint.  Will continue to monitor for progression. ? ? ? ?Dr. Emily Filbert is Medical Director for [place].  ?Dr. Ottie Glazier is Medical Director for [facility] Pulmonary Rehabilitation. ?

## 2022-03-19 DIAGNOSIS — J849 Interstitial pulmonary disease, unspecified: Secondary | ICD-10-CM

## 2022-03-19 NOTE — Progress Notes (Signed)
Daily Session Note ? ?Patient Details  ?Name: Tammy Kelley ?MRN: [id number] ?Date of Birth: Jul 19, 1946 ?Referring Provider:   ?Flowsheet Row Pulmonary Rehab from 01/01/2022 in [facility] Cardiac and Pulmonary Rehab  ?Referring Provider Deon Pilling MD  ? ?  ? ? ?Encounter Date: 03/19/2022 ? ?Check In: ? Session Check In - 03/19/22 1340   ? ?  ? Check-In  ? Supervising physician immediately available to respond to emergencies See telemetry face sheet for immediately available ER MD   ? Location [facility]-Cardiac & Pulmonary Rehab   ? Staff Present Birdie Sons, MPA, RN;Melissa [place], RDN, LDN;Joseph [place], RCP,RRT,BSRT   ? Virtual Visit No   ? Medication changes reported     No   ? Fall or balance concerns reported    No   ? Tobacco Cessation No Change   ? Warm-up and Cool-down Performed on first and last piece of equipment   ? Resistance Training Performed Yes   ? VAD Patient? No   ? PAD/SET Patient? No   ?  ? Pain Assessment  ? Currently in Pain? No/denies   ? ?  ?  ? ?  ? ? ? ? ? ?Social History  ? ?Tobacco Use  ?Smoking Status Former  ? Types: Cigarettes  ? Quit date: 2004  ? Years since quitting: 19.3  ?Smokeless Tobacco Never  ? ? ?Goals Met:  ?Independence with exercise equipment ?Exercise tolerated well ?No report of concerns or symptoms today ?Strength training completed today ? ?Goals Unmet:  ?Not Applicable ? ?Comments: Pt able to follow exercise prescription today without complaint.  Will continue to monitor for progression. ? ? ? ?Dr. Emily Filbert is Medical Director for [place].  ?Dr. Ottie Glazier is Medical Director for [facility] Pulmonary Rehabilitation. ?

## 2022-03-26 ENCOUNTER — Encounter: Payer: Self-pay | Admitting: *Deleted

## 2022-03-26 DIAGNOSIS — J849 Interstitial pulmonary disease, unspecified: Secondary | ICD-10-CM | POA: Diagnosis not present

## 2022-03-26 NOTE — Progress Notes (Signed)
Daily Session Note ? ?Patient Details  ?Name: Tammy Kelley ?MRN: [id number] ?Date of Birth: Feb 12, 1946 ?Referring Provider:   ?Flowsheet Row Pulmonary Rehab from 01/01/2022 in [facility] Cardiac and Pulmonary Rehab  ?Referring Provider Deon Pilling MD  ? ?  ? ? ?Encounter Date: 03/26/2022 ? ?Check In: ? Session Check In - 03/26/22 1333   ? ?  ? Check-In  ? Supervising physician immediately available to respond to emergencies See telemetry face sheet for immediately available ER MD   ? Location [facility]-Cardiac & Pulmonary Rehab   ? Staff Present Birdie Sons, MPA, RN;Melissa [place], RDN, LDN;Joseph [place], RCP,RRT,BSRT   ? Virtual Visit No   ? Medication changes reported     No   ? Fall or balance concerns reported    No   ? Tobacco Cessation No Change   ? Warm-up and Cool-down Performed on first and last piece of equipment   ? Resistance Training Performed Yes   ? VAD Patient? No   ? PAD/SET Patient? No   ?  ? Pain Assessment  ? Currently in Pain? No/denies   ? ?  ?  ? ?  ? ? ? ? ? ?Social History  ? ?Tobacco Use  ?Smoking Status Former  ? Types: Cigarettes  ? Quit date: 2004  ? Years since quitting: 19.3  ?Smokeless Tobacco Never  ? ? ?Goals Met:  ?Independence with exercise equipment ?Exercise tolerated well ?No report of concerns or symptoms today ?Strength training completed today ? ?Goals Unmet:  ?Not Applicable ? ?Comments: Pt able to follow exercise prescription today without complaint.  Will continue to monitor for progression. ? ? ? ?Dr. Emily Filbert is Medical Director for [place].  ?Dr. Ottie Glazier is Medical Director for [facility] Pulmonary Rehabilitation. ?

## 2022-03-26 NOTE — Progress Notes (Signed)
Pulmonary Individual Treatment Plan ? ?Patient Details  ?Name: Tammy Kelley ?MRN: [id number] ?Date of Birth: Sep 13, 1946 ?Referring Provider:   ?Flowsheet Row Pulmonary Rehab from 01/01/2022 in [facility] Cardiac and Pulmonary Rehab  ?Referring Provider Deon Pilling MD  ? ?  ? ? ?Initial Encounter Date:  ?Flowsheet Row Pulmonary Rehab from 01/01/2022 in [facility] Cardiac and Pulmonary Rehab  ?Date 01/01/22  ? ?  ? ? ?Visit Diagnosis: ILD (interstitial lung disease) ([place]) ? ?Patient's Home Medications on Admission: ? ?Current Outpatient Medications:  ?  albuterol (VENTOLIN HFA) 108 (90 Base) MCG/ACT inhaler, Inhale into the lungs., Disp: , Rfl:  ?  amoxicillin (AMOXIL) 500 MG capsule, TK FOUR CS PO 1 HOUR B DAPP, Disp: , Rfl:  ?  Calcium Carbonate-Vitamin D 600-400 MG-UNIT tablet, Take 1 tablet by mouth daily., Disp: , Rfl:  ?  chlorthalidone (HYGROTON) 25 MG tablet, TAKE 1/2 TABLET(12.5 MG) BY MOUTH EVERY MORNING, Disp: , Rfl:  ?  codeine 30 MG tablet, Take by mouth., Disp: , Rfl:  ?  ergocalciferol (VITAMIN D2) 1.25 MG (50000 UT) capsule, Take by mouth., Disp: , Rfl:  ?  famotidine (PEPCID) 40 MG tablet, famotidine 40 mg tablet  TAKE 1 TABLET BY MOUTH IN THE EVENING, Disp: , Rfl:  ?  fluticasone (FLONASE) 50 MCG/ACT nasal spray, SHAKE LIQUID AND USE 1 SPRAY IN EACH NOSTRIL DAILY, Disp: , Rfl:  ?  folic acid (FOLVITE) 1 MG tablet, Take 1 mg by mouth daily. (Patient not taking: Reported on 12/18/2021), Disp: , Rfl:  ?  ipratropium (ATROVENT) 0.06 % nasal spray, 2 sprays into each nostril Three (3) times a day., Disp: , Rfl:  ?  meloxicam (MOBIC) 15 MG tablet, Take 15 mg by mouth daily., Disp: , Rfl:  ?  metoprolol succinate (TOPROL-XL) 25 MG 24 hr tablet, every evening, Disp: , Rfl:  ?  predniSONE (DELTASONE) 2.5 MG tablet, TAKE 3 TABLETS(7.5 MG) BY MOUTH DAILY, Disp: , Rfl:  ?  ranitidine (ZANTAC) 300 MG tablet, , Disp: , Rfl:  ?  sulfamethoxazole-trimethoprim (BACTRIM) 400-80 MG tablet, TAKE 1 TABLET BY MOUTH EVERY MONDAY,  WEDNESDAY, FRIDAY AT 4:00 PM, Disp: , Rfl:  ?  traZODone (DESYREL) 100 MG tablet, Take by mouth., Disp: , Rfl:  ? ?Past Medical History: ?No past medical history on file. ? ?Tobacco Use: ?Social History  ? ?Tobacco Use  ?Smoking Status Former  ? Types: Cigarettes  ? Quit date: 2004  ? Years since quitting: 19.3  ?Smokeless Tobacco Never  ? ? ?Labs: ?Review Flowsheet   ? ?    ? View : No data to display.  ?  ?  ?  ?  ?  ? ? ? ?Pulmonary Assessment Scores: ? Pulmonary Assessment Scores   ? ? [place] Name 01/01/22 1159  ?  ?  ?  ? ADL UCSD  ? ADL Phase Entry    ? SOB Score total 52    ? Rest 0    ? Walk 2    ? Stairs 4    ? Bath 2    ? Dress 2    ? Shop 2    ?  ? CAT Score  ? CAT Score 11    ?  ? mMRC Score  ? mMRC Score 2    ? ?  ?  ? ?  ?  ?UCSD: ?Self-administered rating of dyspnea associated with activities of daily living (ADLs) ?6-point scale (0 = "not at all" to 5 = "maximal or unable  to do because of breathlessness")  ?Scoring Scores range from 0 to 120.  Minimally important difference is 5 units ? ?CAT: ?CAT can identify the health impairment of COPD patients and is better correlated with disease progression.  ?CAT has a scoring range of zero to 40. The CAT score is classified into four groups of low (less than 10), medium (10 - 20), high (21-30) and very high (31-40) based on the impact level of disease on health status. A CAT score over 10 suggests significant symptoms.  A worsening CAT score could be explained by an exacerbation, poor medication adherence, poor inhaler technique, or progression of COPD or comorbid conditions.  ?CAT MCID is 2 points ? ?mMRC: ?mMRC (Modified Medical Research Council) Dyspnea Scale is used to assess the degree of baseline functional disability in patients of respiratory disease due to dyspnea. ?No minimal important difference is established. A decrease in score of 1 point or greater is considered a positive change.  ? ?Pulmonary Function Assessment: ? ? ?Exercise Target  Goals: ?Exercise Program Goal: ?Individual exercise prescription set using results from initial 6 min walk test and THRR while considering  patient?s activity barriers and safety.  ? ?Exercise Prescription Goal: ?Initial exercise prescription builds to 30-45 minutes a day of aerobic activity, 2-3 days per week.  Home exercise guidelines will be given to patient during program as part of exercise prescription that the participant will acknowledge. ? ?Education: Aerobic Exercise: ?- Group verbal and visual presentation on the components of exercise prescription. Introduces F.I.T.T principle from ACSM for exercise prescriptions.  Reviews F.I.T.T. principles of aerobic exercise including progression. Written material given at graduation. ? ? ?Education: Resistance Exercise: ?- Group verbal and visual presentation on the components of exercise prescription. Introduces F.I.T.T principle from ACSM for exercise prescriptions  Reviews F.I.T.T. principles of resistance exercise including progression. Written material given at graduation. ?Flowsheet Row Pulmonary Rehab from 02/19/2022 in [facility] Cardiac and Pulmonary Rehab  ?Date 01/15/22  ?Educator AS  ?Instruction Review Code 1- Verbalizes Understanding  ? ?  ? ?  ?Education: Exercise & Equipment Safety: ?- Individual verbal instruction and demonstration of equipment use and safety with use of the equipment. ?Flowsheet Row Pulmonary Rehab from 02/19/2022 in [facility] Cardiac and Pulmonary Rehab  ?Education need identified 01/01/22  ?Date 01/01/22  ?Educator KL  ?Instruction Review Code 1- Verbalizes Understanding  ? ?  ? ? ?Education: Exercise Physiology & General Exercise Guidelines: ?- Group verbal and written instruction with models to review the exercise physiology of the cardiovascular system and associated critical values. Provides general exercise guidelines with specific guidelines to those with heart or lung disease.  ? ? ?Education: Flexibility, Balance, Mind/Body  Relaxation: ?- Group verbal and visual presentation with interactive activity on the components of exercise prescription. Introduces F.I.T.T principle from ACSM for exercise prescriptions. Reviews F.I.T.T. principles of flexibility and balance exercise training including progression. Also discusses the mind body connection.  Reviews various relaxation techniques to help reduce and manage stress (i.e. Deep breathing, progressive muscle relaxation, and visualization). Balance handout provided to take home. Written material given at graduation. ?Flowsheet Row Pulmonary Rehab from 02/19/2022 in [facility] Cardiac and Pulmonary Rehab  ?Date 01/29/22  ?Educator AS  ?Instruction Review Code 1- Verbalizes Understanding  ? ?  ? ? ?Activity Barriers & Risk Stratification: ? Activity Barriers & Cardiac Risk Stratification - 01/01/22 1218   ? ?  ? Activity Barriers & Cardiac Risk Stratification  ? Activity Barriers Shortness of Breath;Other (comment);Deconditioning   ?  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 ?  ?  ? ?  ? ? ?6 Minute Walk: ? 6 Minute Walk   ? ? [place] Name 01/01/22 1257  ?  ?  ?  ? 6 Minute Walk  ? Phase Initial    ? Distance 905 feet    ? Walk Time 6 minutes    ? # of Rest Breaks 0    ? MPH 1.71    ? METS 1.66    ? RPE 8    ? Perceived Dyspnea  1    ? VO2 Peak 5.81    ? Symptoms Yes (comment)    ? Comments SOB    ? Resting HR 69 bpm    ? Resting BP 126/70    ? Resting Oxygen Saturation  95 %    ? Exercise Oxygen Saturation  during 6 min walk 88 %    ? Max Ex. HR 89 bpm    ? Max Ex. BP 142/72    ? 2 Minute Post BP 122/70    ?  ? Interval HR  ? 1 Minute HR 81    ? 2 Minute HR 85    ? 3 Minute HR 86    ? 4 Minute HR 87    ? 5 Minute HR 89    ? 6 Minute HR 89    ? 2 Minute Post HR 71    ? Interval Heart Rate? Yes    ?  ? Interval Oxygen  ? Interval Oxygen? Yes    ? Baseline Oxygen Saturation % 95 %    ? 1 Minute Oxygen Saturation % 92 %    ? 1 Minute Liters of Oxygen 0 L  RA    ? 2 Minute Oxygen Saturation % 89 %    ? 2  Minute Liters of Oxygen 0 L    ? 3 Minute Oxygen Saturation % 88 %    ? 3 Minute Liters of Oxygen 0 L    ? 4 Minute Oxygen Saturation % 89 %    ? 4 Minute Liters of Oxygen 0 L    ? 5 Minute Oxygen Saturation % 88 %    ? 5 Minute

## 2022-03-31 DIAGNOSIS — J849 Interstitial pulmonary disease, unspecified: Secondary | ICD-10-CM | POA: Diagnosis not present

## 2022-03-31 NOTE — Progress Notes (Signed)
Daily Session Note  Patient Details  Name: Tammy Kelley MRN: [id number] Date of Birth: 13-May-1946 Referring Provider:   Flowsheet Row Pulmonary Rehab from 01/01/2022 in [facility] Cardiac and Pul[facility] Rehab  Referring Provider Deon Pilling MD       Encounter Date: 03/31/2022  Check In:  Session Check In - 03/31/22 1323       Check-In   Supervising physician immediately available to respond to emergencies See telemetry face sheet for immediately available ER MD    Location [facility]-Cardiac & Pulmonary Rehab    Staff Present Birdie Sons, MPA, Nino Glow, MS, ASCM CEP, Exercise Physiologist;Joseph Tessie Fass, [place]    Virtual Visit No    Medication changes reported     No    Fall or balance concerns reported    No    Tobacco Cessation No Change    Warm-up and Cool-down Performed on first and last piece of equipment    Resistance Training Performed Yes    VAD Patient? No    PAD/SET Patient? No      Pain Assessment   Currently in Pain? No/denies                Social History   Tobacco Use  Smoking Status Former   Types: Cigarettes   Quit date: 2004   Years since quitting: 19.4  Smokeless Tobacco Never    Goals Met:  Independence with exercise equipment Exercise tolerated well No report of concerns or symptoms today Strength training completed today  Goals Unmet:  Not Applicable  Comments: Pt able to follow exercise prescription today without complaint.  Will continue to monitor for progression.    Dr. Emily Filbert is Medical Director for [facility].  Dr. Ottie Glazier is Medical Director for [facility] Pulmonary Rehabilitation.

## 2022-04-09 ENCOUNTER — Encounter: Payer: Medicare Other | Admitting: *Deleted

## 2022-04-09 DIAGNOSIS — J849 Interstitial pulmonary disease, unspecified: Secondary | ICD-10-CM | POA: Diagnosis not present

## 2022-04-09 NOTE — Progress Notes (Signed)
Daily Session Note  Patient Details  Name: Tammy Kelley MRN: [id number] Date of Birth: 1946/02/01 Referring Provider:   Flowsheet Row Pulmonary Rehab from 01/01/2022 in [facility] Cardiac and Pulmonary Rehab  Referring Provider Deon Pilling MD       Encounter Date: 04/09/2022  Check In:  Session Check In - 04/09/22 1349       Check-In   Supervising physician immediately available to respond to emergencies See telemetry face sheet for immediately available ER MD    Location [facility]-Cardiac & Pulmonary Rehab    Staff Present Nyoka Cowden, RN, BSN, Tyna Jaksch, MS, ASCM CEP, Exercise Physiologist;Joseph Tessie Fass, [place]    Virtual Visit No    Medication changes reported    Taking Augmentin for sore throat No    Fall or balance concerns reported    No    Tobacco Cessation No Change    Warm-up and Cool-down Performed on first and last piece of equipment    Resistance Training Performed Yes    VAD Patient? No    PAD/SET Patient? No      Pain Assessment   Currently in Pain? No/denies                Social History   Tobacco Use  Smoking Status Former   Types: Cigarettes   Quit date: 2004   Years since quitting: 19.4  Smokeless Tobacco Never    Goals Met:  Independence with exercise equipment Exercise tolerated well No report of concerns or symptoms today  Goals Unmet:  Not Applicable  Comments: .exgoo   Dr. Emily Filbert is Medical Director for [facility].  Dr. Ottie Glazier is Medical Director for [facility] Pulmonary Rehabilitation.

## 2022-04-15 ENCOUNTER — Encounter: Payer: Self-pay | Admitting: *Deleted

## 2022-04-15 ENCOUNTER — Telehealth: Payer: Self-pay | Admitting: *Deleted

## 2022-04-15 DIAGNOSIS — J849 Interstitial pulmonary disease, unspecified: Secondary | ICD-10-CM

## 2022-04-15 NOTE — Progress Notes (Signed)
Discharge Progress Report  Patient Details  Name: Tammy Kelley MRN: [id number] Date of Birth: 05-10-1946 Referring Provider:   [name] Pulmonary Rehab from 01/01/2022 in [facility] Cardiac and Pulmonary Rehab  Referring Provider Deon Pilling MD        Number of Visits: 17  Reason for Discharge:  Early Exit:  Personal  Smoking History:  Social History   Tobacco Use  Smoking Status Former   Types: Cigarettes   Quit date: 2004   Years since quitting: 19.4  Smokeless Tobacco Never    Diagnosis:  ILD (interstitial lung disease) ([place])  ADL UCSD:  Pulmonary Assessment Scores     Row Name 01/01/22 1159         ADL UCSD   ADL Phase Entry     SOB Score total 52     Rest 0     Walk 2     Stairs 4     Bath 2     Dress 2     Shop 2       CAT Score   CAT Score 11       mMRC Score   mMRC Score 2              Initial Exercise Prescription:  Initial Exercise Prescription - 01/01/22 1200       Date of Initial Exercise RX and Referring Provider   Date 01/01/22    Referring Provider Deon Pilling MD      Oxygen   Maintain Oxygen Saturation 88% or higher      NuStep   Level 1    SPM 80    Minutes 15    METs 1.6      REL-XR   Level 1    Speed 50    Minutes 15    METs 1.6      Biostep-RELP   Level 1    SPM 50    Minutes 15    METs 1.5      Track   Laps 24    Minutes 15    METs 2.31      Prescription Details   Frequency (times per week) 2    Duration Progress to 30 minutes of continuous aerobic without signs/symptoms of physical distress      Intensity   THRR 40-80% of Max Heartrate 99 - 130    Ratings of Perceived Exertion 11-13    Perceived Dyspnea 0-4      Progression   Progression Continue to progress workloads to maintain intensity without signs/symptoms of physical distress.      Resistance Training   Training Prescription Yes    Weight 3 lb    Reps 10-15             Discharge Exercise Prescription (Final Exercise  Prescription Changes):  Exercise Prescription Changes - 04/15/22 0900       Response to Exercise   Blood Pressure (Admit) 122/56    Blood Pressure (Exit) 126/62    Heart Rate (Admit) 68 bpm    Heart Rate (Exercise) 94 bpm    Heart Rate (Exit) 73 bpm    Oxygen Saturation (Admit) 92 %    Oxygen Saturation (Exercise) 89 %    Oxygen Saturation (Exit) 92 %    Rating of Perceived Exertion (Exercise) 15    Perceived Dyspnea (Exercise) 1    Symptoms SOB    Duration Continue with 30 min of aerobic exercise without signs/symptoms of physical distress.  Intensity THRR unchanged      Progression   Progression Continue to progress workloads to maintain intensity without signs/symptoms of physical distress.    Average METs 2.13      Resistance Training   Training Prescription Yes    Weight 4 lb    Reps 10-15      Interval Training   Interval Training No      NuStep   Level 4    Minutes 15    METs 2.4      T5 Nustep   Level 3    Minutes 15    METs 1.6      Track   Laps 30    Minutes 15    METs 2.63      Home Exercise Plan   Plans to continue exercise at Home (comment)   walking, Youtube videos   Frequency Add 2 additional days to program exercise sessions.   start with 1   Initial Home Exercises Provided 02/10/22      Oxygen   Maintain Oxygen Saturation 88% or higher             Functional Capacity:  6 Minute Walk     Row Name 01/01/22 1257         6 Minute Walk   Phase Initial     Distance 905 feet     Walk Time 6 minutes     # of Rest Breaks 0     MPH 1.71     METS 1.66     RPE 8     Perceived Dyspnea  1     VO2 Peak 5.81     Symptoms Yes (comment)     Comments SOB     Resting HR 69 bpm     Resting BP 126/70     Resting Oxygen Saturation  95 %     Exercise Oxygen Saturation  during 6 min walk 88 %     Max Ex. HR 89 bpm     Max Ex. BP 142/72     2 Minute Post BP 122/70       Interval HR   1 Minute HR 81     2 Minute HR 85     3 Minute HR  86     4 Minute HR 87     5 Minute HR 89     6 Minute HR 89     2 Minute Post HR 71     Interval Heart Rate? Yes       Interval Oxygen   Interval Oxygen? Yes     Baseline Oxygen Saturation % 95 %     1 Minute Oxygen Saturation % 92 %     1 Minute Liters of Oxygen 0 L  RA     2 Minute Oxygen Saturation % 89 %     2 Minute Liters of Oxygen 0 L     3 Minute Oxygen Saturation % 88 %     3 Minute Liters of Oxygen 0 L     4 Minute Oxygen Saturation % 89 %     4 Minute Liters of Oxygen 0 L     5 Minute Oxygen Saturation % 88 %     5 Minute Liters of Oxygen 0 L     6 Minute Oxygen Saturation % 89 %     6 Minute Liters of Oxygen 0 L     2 Minute Post Oxygen Saturation %  95 %     2 Minute Post Liters of Oxygen 0 L              Psychological, QOL, Others - Outcomes: PHQ 2/9:    01/22/2022    1:45 PM 01/01/2022   11:58 AM  Depression screen PHQ 2/9  Decreased Interest 1 0  Down, Depressed, Hopeless 0 0  PHQ - 2 Score 1 0  Altered sleeping 1 1  Tired, decreased energy 1 2  Change in appetite 0 1  Feeling bad or failure about yourself  0 0  Trouble concentrating 0 2  Moving slowly or fidgety/restless 0 2  Suicidal thoughts 0 0  PHQ-9 Score 3 8  Difficult doing work/chores Somewhat difficult Somewhat difficult         Nutrition & Weight - Outcomes:  Pre Biometrics - 01/01/22 1217       Pre Biometrics   Height 5' 1.2" (1.554 m)    Weight 166 lb 14.4 oz (75.7 kg)    BMI (Calculated) 31.35    Single Leg Stand 4.63 seconds              Nutrition:  Nutrition Therapy & Goals - 01/30/22 1331       Nutrition Therapy   Diet Heart healthy, low Na, pulmonary MNT    Protein (specify units) 90g    Fiber 25 grams    Whole Grain Foods 3 servings    Saturated Fats 16 max. grams    Fruits and Vegetables 8 servings/day    Sodium 2 grams      Personal Nutrition Goals   Nutrition Goal ST: whole wheat bread LT: make at least half grains whole, limit processed meat <  2x/week, increase fruit/vegetable intake to at least 5 servings per day    Comments 76 y.o. F admitted to rehab for ILD. PMHx includes HTN, GERD, lung cancer 2021, COPD. Relevant medications includes amoxicillin, calcium/vit D, vit D, pepcid, folvite, prednisone, trazodone, chlorthalidone.  PYP Score:52. Vegetables & Fruits 5/12. Breads, Grains & Cereals 5/12. Red & Processed Meat 5/12. Poultry 2/2. Fish & Shellfish 0/4. Beans, Nuts & Seeds 1/4. Milk & Dairy Foods 4/6. Toppings, Oils, Seasonings & Salt 12/20. Sweets, Snacks & Restaurant Food 9/14. Beverages 9/10.  Meals vary. B: bacon, sausage, ham with eggs or cereal L: pack of nabs or salad D: chili beans, spaghetti, chinese stirfry, chicken, sandwiches. She reports they eat a lot of salads. Drinks: water and caffiene free diet pepsi. She feels like she eats too much, she will graze throughout the day on honey, candy, crackers, peanuts, or whatever she has on hand. Discussed heart healthy and pulmonary MNT. She reports feeling like she could do better with whole grains - she likes whole wheat bread, but her husband doesnt.      Intervention Plan   Intervention Prescribe, educate and counsel regarding individualized specific dietary modifications aiming towards targeted core components such as weight, hypertension, lipid management, diabetes, heart failure and other comorbidities.    Expected Outcomes Short Term Goal: Understand basic principles of dietary content, such as calories, fat, sodium, cholesterol and nutrients.;Short Term Goal: A plan has been developed with personal nutrition goals set during dietitian appointment.;Long Term Goal: Adherence to prescribed nutrition plan.            Goals reviewed with patient; copy given to patient.

## 2022-04-15 NOTE — Telephone Encounter (Signed)
Pt called, she was recently admitted with pneumonia and has several appointments and vacations coming up.  She would like to discharge at this time.

## 2022-04-15 NOTE — Progress Notes (Signed)
Pulmonary Individual Treatment Plan  Patient Details  Name: Tammy Kelley MRN: [id number] Date of Birth: Jun 04, 1946 Referring Provider:   [name] Pulmonary Rehab from 01/01/2022 in [facility] Cardiac and Pulmonary Rehab  Referring Provider Deon Pilling MD       Initial Encounter Date:  Flowsheet Row Pulmonary Rehab from 01/01/2022 in [facility] Cardiac and Pulmonary Rehab  Date 01/01/22       Visit Diagnosis: ILD (interstitial lung disease) ([place])  Patient's Home Medications on Admission:  Current Outpatient Medications:    albuterol (VENTOLIN HFA) 108 (90 Base) MCG/ACT inhaler, Inhale into the lungs., Disp: , Rfl:    amoxicillin (AMOXIL) 500 MG capsule, TK FOUR CS PO 1 HOUR B DAPP, Disp: , Rfl:    Calcium Carbonate-Vitamin D 600-400 MG-UNIT tablet, Take 1 tablet by mouth daily., Disp: , Rfl:    chlorthalidone (HYGROTON) 25 MG tablet, TAKE 1/2 TABLET(12.5 MG) BY MOUTH EVERY MORNING, Disp: , Rfl:    codeine 30 MG tablet, Take by mouth., Disp: , Rfl:    ergocalciferol (VITAMIN D2) 1.25 MG (50000 UT) capsule, Take by mouth., Disp: , Rfl:    famotidine (PEPCID) 40 MG tablet, famotidine 40 mg tablet  TAKE 1 TABLET BY MOUTH IN THE EVENING, Disp: , Rfl:    fluticasone (FLONASE) 50 MCG/ACT nasal spray, SHAKE LIQUID AND USE 1 SPRAY IN EACH NOSTRIL DAILY, Disp: , Rfl:    folic acid (FOLVITE) 1 MG tablet, Take 1 mg by mouth daily. (Patient not taking: Reported on 12/18/2021), Disp: , Rfl:    ipratropium (ATROVENT) 0.06 % nasal spray, 2 sprays into each nostril Three (3) times a day., Disp: , Rfl:    meloxicam (MOBIC) 15 MG tablet, Take 15 mg by mouth daily., Disp: , Rfl:    metoprolol succinate (TOPROL-XL) 25 MG 24 hr tablet, every evening, Disp: , Rfl:    predniSONE (DELTASONE) 2.5 MG tablet, TAKE 3 TABLETS(7.5 MG) BY MOUTH DAILY, Disp: , Rfl:    ranitidine (ZANTAC) 300 MG tablet, , Disp: , Rfl:    sulfamethoxazole-trimethoprim (BACTRIM) 400-80 MG tablet, TAKE 1 TABLET BY MOUTH EVERY MONDAY,  WEDNESDAY, FRIDAY AT 4:00 PM, Disp: , Rfl:    traZODone (DESYREL) 100 MG tablet, Take by mouth., Disp: , Rfl:   Past Medical History: No past medical history on file.  Tobacco Use: Social History   Tobacco Use  Smoking Status Former   Types: Cigarettes   Quit date: 2004   Years since quitting: 19.4  Smokeless Tobacco Never    Labs: Review Manufacturing engineer : No data to display.               Pulmonary Assessment Scores:  Pulmonary Assessment Scores     Row Name 01/01/22 1159         ADL UCSD   ADL Phase Entry     SOB Score total 52     Rest 0     Walk 2     Stairs 4     Bath 2     Dress 2     Shop 2       CAT Score   CAT Score 11       mMRC Score   mMRC Score 2              UCSD: Self-administered rating of dyspnea associated with activities of daily living (ADLs) 6-point scale (0 = "not at all" to 5 = "maximal or unable  to do because of breathlessness")  Scoring Scores range from 0 to 120.  Minimally important difference is 5 units  CAT: CAT can identify the health impairment of COPD patients and is better correlated with disease progression.  CAT has a scoring range of zero to 40. The CAT score is classified into four groups of low (less than 10), medium (10 - 20), high (21-30) and very high (31-40) based on the impact level of disease on health status. A CAT score over 10 suggests significant symptoms.  A worsening CAT score could be explained by an exacerbation, poor medication adherence, poor inhaler technique, or progression of COPD or comorbid conditions.  CAT MCID is 2 points  mMRC: mMRC (Modified Medical Research Council) Dyspnea Scale is used to assess the degree of baseline functional disability in patients of respiratory disease due to dyspnea. No minimal important difference is established. A decrease in score of 1 point or greater is considered a positive change.   Pulmonary Function Assessment:   Exercise Target  Goals: Exercise Program Goal: Individual exercise prescription set using results from initial 6 min walk test and THRR while considering  patient's activity barriers and safety.   Exercise Prescription Goal: Initial exercise prescription builds to 30-45 minutes a day of aerobic activity, 2-3 days per week.  Home exercise guidelines will be given to patient during program as part of exercise prescription that the participant will acknowledge.  Education: Aerobic Exercise: - Group verbal and visual presentation on the components of exercise prescription. Introduces F.I.T.T principle from ACSM for exercise prescriptions.  Reviews F.I.T.T. principles of aerobic exercise including progression. Written material given at graduation.   Education: Resistance Exercise: - Group verbal and visual presentation on the components of exercise prescription. Introduces F.I.T.T principle from ACSM for exercise prescriptions  Reviews F.I.T.T. principles of resistance exercise including progression. Written material given at graduation. Flowsheet Row Pulmonary Rehab from 03/26/2022 in [facility] Cardiac and Pulmonary Rehab  Date 01/15/22  Educator [place]  Instruction Review Code 1- Verbalizes Understanding        Education: Exercise & Equipment Safety: - Individual verbal instruction and demonstration of equipment use and safety with use of the equipment. Flowsheet Row Pulmonary Rehab from 03/26/2022 in [facility] Cardiac and Pulmonary Rehab  Education need identified 01/01/22  Date 01/01/22  Educator [place]  Instruction Review Code 1- Verbalizes Understanding       Education: Exercise Physiology & General Exercise Guidelines: - Group verbal and written instruction with models to review the exercise physiology of the cardiovascular system and associated critical values. Provides general exercise guidelines with specific guidelines to those with heart or lung disease.    Education: Flexibility, Balance, Mind/Body  Relaxation: - Group verbal and visual presentation with interactive activity on the components of exercise prescription. Introduces F.I.T.T principle from ACSM for exercise prescriptions. Reviews F.I.T.T. principles of flexibility and balance exercise training including progression. Also discusses the mind body connection.  Reviews various relaxation techniques to help reduce and manage stress (i.e. Deep breathing, progressive muscle relaxation, and visualization). Balance handout provided to take home. Written material given at graduation. 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 HR 89 bpm     Max Ex. BP 142/72     2 Minute Post BP 122/70       Interval HR   1 Minute HR 81     2 Minute HR 85     3 Minute HR 86     4 Minute HR 87     5 Minute HR 89     6 Minute HR 89     2 Minute Post HR 71     Interval Heart Rate? Yes       Interval Oxygen   Interval Oxygen? Yes     Baseline Oxygen Saturation % 95 %     1 Minute Oxygen Saturation % 92 %     1 Minute Liters of Oxygen 0 L  RA     2 Minute Oxygen Saturation % 89 %     2  Minute Liters of Oxygen 0 L     3 Minute Oxygen Saturation % 88 %     3 Minute Liters of Oxygen 0 L     4 Minute Oxygen Saturation % 89 %     4 Minute Liters of Oxygen 0 L     5 Minute Oxygen Saturation % 88 %     5 Minute Liters of Oxygen 0 L     6 Minute Oxygen Saturation % 89 %     6 Minute Liters of Oxygen 0 L     2 Minute Post Oxygen Saturation % 95 %     2 Minute Post Liters of Oxygen 0 L             Oxygen Initial Assessment:  Oxygen Initial Assessment - 01/01/22 1159       Home Oxygen   Home Oxygen Device None    Sleep Oxygen Prescription Continuous;CPAP    Liters per minute 1    Home Exercise Oxygen Prescription None    Home Resting Oxygen Prescription None    Compliance with Home Oxygen Use Yes      Initial 6 min Walk   Oxygen Used None      Program Oxygen Prescription   Program Oxygen Prescription None      Intervention   Short Term Goals To learn and exhibit compliance with exercise, home and travel O2 prescription;To learn and understand importance of monitoring SPO2 with pulse oximeter and demonstrate accurate use of the pulse oximeter.;To learn and understand importance of maintaining oxygen saturations>88%;To learn and demonstrate proper pursed lip breathing techniques or other breathing techniques. ;To  learn and demonstrate proper use of respiratory medications    Long  Term Goals Exhibits compliance with exercise, home  and travel O2 prescription;Verbalizes importance of monitoring SPO2 with pulse oximeter and return demonstration;Maintenance of O2 saturations>88%;Exhibits proper breathing techniques, such as pursed lip breathing or other method taught during program session;Compliance with respiratory medication;Demonstrates proper use of MDI's             Oxygen Re-Evaluation:  Oxygen Re-Evaluation     Row Name 01/15/22 1342 02/10/22 1519           Program Oxygen Prescription   Program Oxygen Prescription None None        Home Oxygen   Home  Oxygen Device None None      Sleep Oxygen Prescription Continuous;CPAP Continuous;CPAP      Liters per minute 1 1      Home Exercise Oxygen Prescription None None      Home Resting Oxygen Prescription None None      Compliance with Home Oxygen Use Yes Yes        Goals/Expected Outcomes   Short Term Goals To learn and exhibit compliance with exercise, home and travel O2 prescription;To learn and understand importance of monitoring SPO2 with pulse oximeter and demonstrate accurate use of the pulse oximeter.;To learn and understand importance of maintaining oxygen saturations>88%;To learn and demonstrate proper pursed lip breathing techniques or other breathing techniques.  To learn and exhibit compliance with exercise, home and travel O2 prescription;To learn and understand importance of monitoring SPO2 with pulse oximeter and demonstrate accurate use of the pulse oximeter.;To learn and understand importance of maintaining oxygen saturations>88%;To learn and demonstrate proper pursed lip breathing techniques or other breathing techniques.       Long  Term Goals Exhibits compliance with exercise, home  and travel O2 prescription;Verbalizes importance of monitoring SPO2 with pulse oximeter and return demonstration;Maintenance of O2 saturations>88%;Exhibits proper breathing techniques, such as pursed lip breathing or other method taught during program session Exhibits compliance with exercise, home  and travel O2 prescription;Verbalizes importance of monitoring SPO2 with pulse oximeter and return demonstration;Maintenance of O2 saturations>88%;Exhibits proper breathing techniques, such as pursed lip breathing or other method taught during program session      Comments Reviewed PLB technique with pt.  Talked about how it works and it's importance in maintaining their exercise saturations. Tammy Kelley feels liker her breathing has improved since she started here- she is now able to walk a hill in her neighborhood that  she wasn't able to do before. She hsa been checking her HR & O2 but not during exercise, which I encouraged her to start at home. She knows when and how to use PLB.      Goals/Expected Outcomes Short: Become more profiecient at using PLB.   Long: Become independent at using PLB. Short: Become more profiecient at using PLB.   Long: Become independent at using PLB.               Oxygen Discharge (Final Oxygen Re-Evaluation):  Oxygen Re-Evaluation - 02/10/22 1519       Program Oxygen Prescription   Program Oxygen Prescription None      Home Oxygen   Home Oxygen Device None    Sleep Oxygen Prescription Continuous;CPAP    Liters per minute 1    Home Exercise Oxygen Prescription None    Home Resting Oxygen Prescription None    Compliance with Home Oxygen Use Yes      Goals/Expected Outcomes  Short Term Goals To learn and exhibit compliance with exercise, home and travel O2 prescription;To learn and understand importance of monitoring SPO2 with pulse oximeter and demonstrate accurate use of the pulse oximeter.;To learn and understand importance of maintaining oxygen saturations>88%;To learn and demonstrate proper pursed lip breathing techniques or other breathing techniques.     Long  Term Goals Exhibits compliance with exercise, home  and travel O2 prescription;Verbalizes importance of monitoring SPO2 with pulse oximeter and return demonstration;Maintenance of O2 saturations>88%;Exhibits proper breathing techniques, such as pursed lip breathing or other method taught during program session    Comments Tammy Kelley feels liker her breathing has improved since she started here- she is now able to walk a hill in her neighborhood that she wasn't able to do before. She hsa been checking her HR & O2 but not during exercise, which I encouraged her to start at home. She knows when and how to use PLB.    Goals/Expected Outcomes Short: Become more profiecient at using PLB.   Long: Become independent at using  PLB.             Initial Exercise Prescription:  Initial Exercise Prescription - 01/01/22 1200       Date of Initial Exercise RX and Referring Provider   Date 01/01/22    Referring Provider Deon Pilling MD      Oxygen   Maintain Oxygen Saturation 88% or higher      NuStep   Level 1    SPM 80    Minutes 15    METs 1.6      REL-XR   Level 1    Speed 50    Minutes 15    METs 1.6      Biostep-RELP   Level 1    SPM 50    Minutes 15    METs 1.5      Track   Laps 24    Minutes 15    METs 2.31      Prescription Details   Frequency (times per week) 2    Duration Progress to 30 minutes of continuous aerobic without signs/symptoms of physical distress      Intensity   THRR 40-80% of Max Heartrate 99 - 130    Ratings of Perceived Exertion 11-13    Perceived Dyspnea 0-4      Progression   Progression Continue to progress workloads to maintain intensity without signs/symptoms of physical distress.      Resistance Training   Training Prescription Yes    Weight 3 lb    Reps 10-15             Perform Capillary Blood Glucose checks as needed.  Exercise Prescription Changes:   Exercise Prescription Changes     Row Name 01/01/22 1200 01/20/22 1100 02/10/22 1500 02/19/22 1100 03/03/22 1700     Response to Exercise   Blood Pressure (Admit) 126/70 122/60 -- 118/64 142/60   Blood Pressure (Exercise) 142/72 182/68 -- 128/64 --   Blood Pressure (Exit) 122/70 128/62 -- 124/62 136/66   Heart Rate (Admit) 69 bpm 73 bpm -- 63 bpm 70 bpm   Heart Rate (Exercise) 89 bpm 94 bpm -- 94 bpm 94 bpm   Heart Rate (Exit) 71 bpm 82 bpm -- 81 bpm 84 bpm   Oxygen Saturation (Admit) 95 % 96 % -- 94 % 95 %   Oxygen Saturation (Exercise) 88 % 88 % -- 88 % 88 %   Oxygen Saturation (Exit) 95 %  94 % -- 95 % 92 %   Rating of Perceived Exertion (Exercise) 8 13 -- 13 12   Perceived Dyspnea (Exercise) 1 2 -- 2 1   Symptoms SOB SOB -- SOB SOB   Comments walk test results 1st day of  exercise -- -- --   Duration -- Progress to 30 minutes of  aerobic without signs/symptoms of physical distress -- Continue with 30 min of aerobic exercise without signs/symptoms of physical distress. Continue with 30 min of aerobic exercise without signs/symptoms of physical distress.   Intensity -- THRR unchanged -- THRR unchanged THRR unchanged     Progression   Progression -- Continue to progress workloads to maintain intensity without signs/symptoms of physical distress. -- Continue to progress workloads to maintain intensity without signs/symptoms of physical distress. Continue to progress workloads to maintain intensity without signs/symptoms of physical distress.   Average METs -- 2 -- 2.38 2.41     Resistance Training   Training Prescription -- Yes -- Yes Yes   Weight -- 3 lb -- 4 lb 4 lb   Reps -- 10-15 -- 10-15 10-15     Interval Training   Interval Training -- No -- No No     NuStep   Level -- 1 -- 2 --   Minutes -- 15 -- 15 --   METs -- -- -- 2.5 --     T5 Nustep   Level -- -- -- 1 3   Minutes -- -- -- 15 15   METs -- -- -- 1.8 2.2     Track   Laps -- 24 -- 34 30   Minutes -- 15 -- 15 15   METs -- 2.31 -- 2.85 2.63     Home Exercise Plan   Plans to continue exercise at -- -- Home (comment)  walking, Youtube videos Home (comment)  walking, Youtube videos Home (comment)  walking, Youtube videos   Frequency -- -- Add 2 additional days to program exercise sessions.  start with 1 Add 2 additional days to program exercise sessions.  start with 1 Add 2 additional days to program exercise sessions.  start with 1   Initial Home Exercises Provided -- -- 02/10/22 02/10/22 02/10/22     Oxygen   Maintain Oxygen Saturation -- 88% or higher 88% or higher 88% or higher 88% or higher    Row Name 03/18/22 1600 04/03/22 1500 04/15/22 0900         Response to Exercise   Blood Pressure (Admit) 124/62 104/60 122/56     Blood Pressure (Exercise) -- 138/62 --     Blood Pressure  (Exit) 142/70 122/60 126/62     Heart Rate (Admit) 70 bpm 68 bpm 68 bpm     Heart Rate (Exercise) 94 bpm 88 bpm 94 bpm     Heart Rate (Exit) 68 bpm 76 bpm 73 bpm     Oxygen Saturation (Admit) 97 % 93 % 92 %     Oxygen Saturation (Exercise) 87 % 90 % 89 %     Oxygen Saturation (Exit) 97 % 92 % 92 %     Rating of Perceived Exertion (Exercise) _0 Perceived Dyspnea (Exercise) _1 Symptoms SOB SOB SOB     Duration Continue with 30 min of aerobic exercise without signs/symptoms of physical distress. Continue with 30 min of aerobic exercise without signs/symptoms of physical distress. Continue with 30 min of aerobic exercise without  signs/symptoms of physical distress.     Intensity THRR unchanged THRR unchanged THRR unchanged       Progression   Progression Continue to progress workloads to maintain intensity without signs/symptoms of physical distress. Continue to progress workloads to maintain intensity without signs/symptoms of physical distress. Continue to progress workloads to maintain intensity without signs/symptoms of physical distress.     Average METs 2.26 2.36 2.13       Resistance Training   Training Prescription Yes Yes Yes     Weight 4 lb 4 lb 4 lb     Reps 10-15 10-15 10-15       Interval Training   Interval Training No No No       NuStep   Level -- 4 4     Minutes -- 15 15     METs -- 2.4 2.4       T5 Nustep   Level _0 Minutes _1 METs 2 1.8 1.6       Track   Laps _2 Minutes _3 METs 2.52 2.63 2.63       Home Exercise Plan   Plans to continue exercise at Home (comment)  walking, Youtube videos Home (comment)  walking, Youtube videos Home (comment)  walking, Youtube videos     Frequency Add 2 additional days to program exercise sessions.  start with 1 Add 2 additional days to program exercise sessions.  start with 1 Add 2 additional days to program exercise sessions.  start with 1     Initial Home Exercises  Provided 02/10/22 02/10/22 02/10/22       Oxygen   Maintain Oxygen Saturation 88% or higher 88% or higher 88% or higher              Exercise Comments:   Exercise Comments     Row Name 01/15/22 1341           Exercise Comments First full day of exercise!  Patient was oriented to gym and equipment including functions, settings, policies, and procedures.  Patient's individual exercise prescription and treatment plan were reviewed.  All starting workloads were established based on the results of the 6 minute walk test done at initial orientation visit.  The plan for exercise progression was also introduced and progression will be customized based on patient's performance and goals.                Exercise Goals and Review:   Exercise Goals     Row Name 01/01/22 1223             Exercise Goals   Increase Physical Activity Yes       Intervention Provide advice, education, support and counseling about physical activity/exercise needs.;Develop an individualized exercise prescription for aerobic and resistive training based on initial evaluation findings, risk stratification, comorbidities and participant's personal goals.       Expected Outcomes Short Term: Attend rehab on a regular basis to increase amount of physical activity.;Long Term: Add in home exercise to make exercise part of routine and to increase amount of physical activity.;Long Term: Exercising regularly at least 3-5 days a week.       Increase Strength and Stamina Yes       Intervention Provide advice, education, support and counseling about physical activity/exercise needs.;Develop an individualized exercise prescription for aerobic and resistive training based  on initial evaluation findings, risk stratification, comorbidities and participant's personal goals.       Expected Outcomes Short Term: Increase workloads from initial exercise prescription for resistance, speed, and METs.;Short Term: Perform resistance  training exercises routinely during rehab and add in resistance training at home;Long Term: Improve cardiorespiratory fitness, muscular endurance and strength as measured by increased METs and functional capacity (6MWT)       Able to understand and use rate of perceived exertion (RPE) scale Yes       Intervention Provide education and explanation on how to use RPE scale       Expected Outcomes Short Term: Able to use RPE daily in rehab to express subjective intensity level;Long Term:  Able to use RPE to guide intensity level when exercising independently       Able to understand and use Dyspnea scale Yes       Intervention Provide education and explanation on how to use Dyspnea scale       Expected Outcomes Short Term: Able to use Dyspnea scale daily in rehab to express subjective sense of shortness of breath during exertion;Long Term: Able to use Dyspnea scale to guide intensity level when exercising independently       Knowledge and understanding of Target Heart Rate Range (THRR) Yes       Intervention Provide education and explanation of THRR including how the numbers were predicted and where they are located for reference       Expected Outcomes Short Term: Able to state/look up THRR;Long Term: Able to use THRR to govern intensity when exercising independently;Short Term: Able to use daily as guideline for intensity in rehab       Able to check pulse independently Yes       Intervention Provide education and demonstration on how to check pulse in carotid and radial arteries.;Review the importance of being able to check your own pulse for safety during independent exercise       Expected Outcomes Short Term: Able to explain why pulse checking is important during independent exercise;Long Term: Able to check pulse independently and accurately       Understanding of Exercise Prescription Yes       Intervention Provide education, explanation, and written materials on patient's individual exercise  prescription       Expected Outcomes Short Term: Able to explain program exercise prescription;Long Term: Able to explain home exercise prescription to exercise independently                Exercise Goals Re-Evaluation :  Exercise Goals Re-Evaluation     Row Name 01/15/22 1341 01/20/22 1135 02/10/22 1444 02/19/22 1200 03/03/22 1737     Exercise Goal Re-Evaluation   Exercise Goals Review Able to understand and use rate of perceived exertion (RPE) scale;Knowledge and understanding of Target Heart Rate Range (THRR);Understanding of Exercise Prescription;Increase Physical Activity;Increase Strength and Stamina;Able to understand and use Dyspnea scale;Able to check pulse independently Increase Physical Activity;Increase Strength and Stamina Increase Physical Activity;Increase Strength and Stamina;Understanding of Exercise Prescription Increase Physical Activity;Increase Strength and Stamina Increase Physical Activity;Increase Strength and Stamina   Comments Reviewed RPE and dyspnea scales, THR and program prescription with pt today.  Pt voiced understanding and was given a copy of goals to take home. Tammy Kelley completed her first full day of exercise and tolerated it well. Her O2 dropped to 88% and staff will continue to monitor her O2 levels as well as progression as she gets further into the program.  Reviewed home exercise with pt today.  Pt plans to walk and do Youtube videos for exercise.  Patient is thinking about joining a gym once she feels ready to do so. Reviewed THR, pulse, RPE, sign and symptoms, pulse oximetery and when to call 911 or MD.  Also discussed weather considerations and indoor options.  Pt voiced understanding. Tammy Kelley continues to do well in rehab. She is now up to 4 lbs for resistance training and has reached a max of 34 laps on the track! We will continue to monitor. Tammy Kelley has only attended 1 rehab session since last review. She had a passing in the family and will be out for the  next couple of weeks. We hope to see her pick up where she left off and continue to improve overtime. Will continue to monitor once she returns.   Expected Outcomes Short: Use RPE daily to regulate intensity. Long: Follow program prescription in THR. Short: Continue current exercise prescription Long: Start to build up overall MET level Short: Check HR & O2 during exercise Long: Exercise independently at home at appropriate prescription Short: Continue to build up number of laps on track Long: Continue to increase overall MET level Short: Resume routine attendance Long: Continue to build up overall strength and stamina    Row Name 03/18/22 1605 04/03/22 1510 04/15/22 0908         Exercise Goal Re-Evaluation   Exercise Goals Review Increase Physical Activity;Increase Strength and Stamina;Understanding of Exercise Prescription Increase Physical Activity;Increase Strength and Stamina;Understanding of Exercise Prescription Increase Physical Activity;Increase Strength and Stamina;Understanding of Exercise Prescription     Comments Tammy Kelley returned this week after being out with her husband passing.  She was eager to return to rehab.  We will continue to montior her progress. Tammy Kelley has missed quite a few sessions due to her family passing and is slowly getting back into the routine of things. She did increase her laps on the track to 30 and moved up to level 4 on the T4 Nustep. We will continue to monitor. Tammy Kelley is still trying to get back into the routine of rehab. She did increase her level on the T4 to level 3. And she has tolerated using 4 lb hand weights for resistance training. We will continue to monitor her progress in the program.     Expected Outcomes Short: Continue attend regularly again Long: Conitnue to improve stamina. Short: Continue to work up laps on the track Long: Continue to increase overall MET level Short: Continue to increase laps on the track. Long: Continue to increase strength and  stamina.              Discharge Exercise Prescription (Final Exercise Prescription Changes):  Exercise Prescription Changes - 04/15/22 0900       Response to Exercise   Blood Pressure (Admit) 122/56    Blood Pressure (Exit) 126/62    Heart Rate (Admit) 68 bpm    Heart Rate (Exercise) 94 bpm    Heart Rate (Exit) 73 bpm    Oxygen Saturation (Admit) 92 %    Oxygen Saturation (Exercise) 89 %    Oxygen Saturation (Exit) 92 %    Rating of Perceived Exertion (Exercise) 15    Perceived Dyspnea (Exercise) 1    Symptoms SOB    Duration Continue with 30 min of aerobic exercise without signs/symptoms of physical distress.    Intensity THRR unchanged      Progression   Progression Continue to progress workloads to maintain  intensity without signs/symptoms of physical distress.    Average METs 2.13      Resistance Training   Training Prescription Yes    Weight 4 lb    Reps 10-15      Interval Training   Interval Training No      NuStep   Level 4    Minutes 15    METs 2.4      T5 Nustep   Level 3    Minutes 15    METs 1.6      Track   Laps 30    Minutes 15    METs 2.63      Home Exercise Plan   Plans to continue exercise at Home (comment)   walking, Youtube videos   Frequency Add 2 additional days to program exercise sessions.   start with 1   Initial Home Exercises Provided 02/10/22      Oxygen   Maintain Oxygen Saturation 88% or higher             Nutrition:  Target Goals: Understanding of nutrition guidelines, daily intake of sodium <1518m, cholesterol <2062m calories 30% from fat and 7% or less from saturated fats, daily to have 5 or more servings of fruits and vegetables.  Education: All About Nutrition: -Group instruction provided by verbal, written material, interactive activities, discussions, models, and posters to present general guidelines for heart healthy nutrition including fat, fiber, MyPlate, the role of sodium in heart healthy nutrition,  utilization of the nutrition label, and utilization of this knowledge for meal planning. Follow up email sent as well. Written material given at graduation.   Biometrics:  Pre Biometrics - 01/01/22 1217       Pre Biometrics   Height 5' 1.2" (1.554 m)    Weight 166 lb 14.4 oz (75.7 kg)    BMI (Calculated) 31.35    Single Leg Stand 4.63 seconds              Nutrition Therapy Plan and Nutrition Goals:  Nutrition Therapy & Goals - 01/30/22 1331       Nutrition Therapy   Diet Heart healthy, low Na, pulmonary MNT    Protein (specify units) 90g    Fiber 25 grams    Whole Grain Foods 3 servings    Saturated Fats 16 max. grams    Fruits and Vegetables 8 servings/day    Sodium 2 grams      Personal Nutrition Goals   Nutrition Goal ST: whole wheat bread LT: make at least half grains whole, limit processed meat < 2x/week, increase fruit/vegetable intake to at least 5 servings per day    Comments [id number]. F admitted to rehab for ILD. PMHx includes HTN, GERD, lung cancer 2021, COPD. Relevant medications includes amoxicillin, calcium/vit D, vit D, pepcid, folvite, prednisone, trazodone, chlorthalidone.  PYP Score:52. Vegetables & Fruits 5/12. Breads, Grains & Cereals 5/12. Red & Processed Meat 5/12. Poultry 2/2. Fish & Shellfish 0/4. Beans, Nuts & Seeds 1/4. Milk & Dairy Foods 4/6. Toppings, Oils, Seasonings & Salt 12/20. Sweets, Snacks & Restaurant Food 9/14. Beverages 9/10.  Meals vary. B: bacon, sausage, ham with eggs or cereal L: pack of nabs or salad D: chili beans, spaghetti, chinese stirfry, chicken, sandwiches. She reports they eat a lot of salads. Drinks: water and caffiene free diet pepsi. She feels like she eats too much, she will graze throughout the day on honey, candy, crackers, peanuts, or whatever she has on hand. Discussed heart healthy and  pulmonary MNT. She reports feeling like she could do better with whole grains - she likes whole wheat bread, but her husband doesnt.       Intervention Plan   Intervention Prescribe, educate and counsel regarding individualized specific dietary modifications aiming towards targeted core components such as weight, hypertension, lipid management, diabetes, heart failure and other comorbidities.    Expected Outcomes Short Term Goal: Understand basic principles of dietary content, such as calories, fat, sodium, cholesterol and nutrients.;Short Term Goal: A plan has been developed with personal nutrition goals set during dietitian appointment.;Long Term Goal: Adherence to prescribed nutrition plan.             Nutrition Assessments:  MEDIFICTS Score Key: ?70 Need to make dietary changes  40-70 Heart Healthy Diet ? 40 Therapeutic Level Cholesterol Diet  Flowsheet Row Pulmonary Rehab from 01/01/2022 in [facility] Cardiac and Pulmonary Rehab  Picture Your Plate Total Score on Admission 52      Picture Your Plate Scores: <03 Unhealthy dietary pattern with much room for improvement. 41-50 Dietary pattern unlikely to meet recommendations for good health and room for improvement. 51-60 More healthful dietary pattern, with some room for improvement.  >60 Healthy dietary pattern, although there may be some specific behaviors that could be improved.   Nutrition Goals Re-Evaluation:  Nutrition Goals Re-Evaluation     Mena Name 01/20/22 1347             Goals   Nutrition Goal Meet with RD 01/30/2022.       Comment Patient is meeting the the dietitian 01/30/2022.       Expected Outcome Short: meet with RD. Long: adhere to a diet plan that pertains to her.                Nutrition Goals Discharge (Final Nutrition Goals Re-Evaluation):  Nutrition Goals Re-Evaluation - 01/20/22 1347       Goals   Nutrition Goal Meet with RD 01/30/2022.    Comment Patient is meeting the the dietitian 01/30/2022.    Expected Outcome Short: meet with RD. Long: adhere to a diet plan that pertains to her.             Psychosocial: Target  Goals: Acknowledge presence or absence of significant depression and/or stress, maximize coping skills, provide positive support system. Participant is able to verbalize types and ability to use techniques and skills needed for reducing stress and depression.   Education: Stress, Anxiety, and Depression - Group verbal and visual presentation to define topics covered.  Reviews how body is impacted by stress, anxiety, and depression.  Also discusses healthy ways to reduce stress and to treat/manage anxiety and depression.  Written material given at graduation.   Education: Sleep Hygiene -Provides group verbal and written instruction about how sleep can affect your health.  Define sleep hygiene, discuss sleep cycles and impact of sleep habits. Review good sleep hygiene tips.    Initial Review & Psychosocial Screening:  Initial Psych Review & Screening - 12/18/21 1539       Initial Review   Current issues with Current Sleep Concerns;Current Stress Concerns    Source of Stress Concerns Family    Comments husband has lymphoma (diagnosed in 2021)      [place]? Yes   family, church     Barriers   Psychosocial barriers to participate in program There are no identifiable barriers or psychosocial needs.;The patient should benefit from training in stress management  and relaxation.      Screening Interventions   Interventions Encouraged to exercise;Provide feedback about the scores to participant;To provide support and resources with identified psychosocial needs    Expected Outcomes Short Term goal: Utilizing psychosocial counselor, staff and physician to assist with identification of specific Stressors or current issues interfering with healing process. Setting desired goal for each stressor or current issue identified.;Long Term Goal: Stressors or current issues are controlled or eliminated.;Short Term goal: Identification and review with participant of any Quality of  Life or Depression concerns found by scoring the questionnaire.;Long Term goal: The participant improves quality of Life and PHQ9 Scores as seen by post scores and/or verbalization of changes             Quality of Life Scores:  Scores of 19 and below usually indicate a poorer quality of life in these areas.  A difference of  2-3 points is a clinically meaningful difference.  A difference of 2-3 points in the total score of the Quality of Life Index has been associated with significant improvement in overall quality of life, self-image, physical symptoms, and general health in studies assessing change in quality of life.  PHQ-9: Review Flowsheet        01/22/2022 01/01/2022  Depression screen PHQ 2/9  Decreased Interest 1 0  Down, Depressed, Hopeless 0 0  PHQ - 2 Score 1 0  Altered sleeping 1 1  Tired, decreased energy 1 2  Change in appetite 0 1  Feeling bad or failure about yourself  0 0  Trouble concentrating 0 2  Moving slowly or fidgety/restless 0 2  Suicidal thoughts 0 0  PHQ-9 Score 3 8  Difficult doing work/chores Somewhat difficult Somewhat difficult         Interpretation of Total Score  Total Score Depression Severity:  1-4 = Minimal depression, 5-9 = Mild depression, 10-14 = Moderate depression, 15-19 = Moderately severe depression, 20-27 = Severe depression   Psychosocial Evaluation and Intervention:  Psychosocial Evaluation - 12/18/21 1601       Psychosocial Evaluation & Interventions   Interventions Encouraged to exercise with the program and follow exercise prescription;Stress management education    Comments Tammy Kelley is coming to pulmonary rehab for ILD. When she was referred in December her breathing was worse. Since then she has had cataract surgery which is why she had to delay starting. Her breathing has improved but she is still ready to get started in the program. She does report high stress levels related to her husband's diagnosis of lymphoma in 2021.  They have spent a lot of time in the hospital but she reports his health is getting more stable. She does have a good support system of family and her church.    Expected Outcomes Short: attend pulmonary rehab for education and exercise Long: develop and maintain positive self care habits.    Continue Psychosocial Services  Follow up required by staff             Psychosocial Re-Evaluation:  Psychosocial Re-Evaluation     Braden Name 01/22/22 1346 02/10/22 1511           Psychosocial Re-Evaluation   Current issues with Current Stress Concerns Current Sleep Concerns;Current Stress Concerns      Comments Reviewed patient health questionnaire (PHQ-9) with patient for follow up. Previously, patients score indicated signs/symptoms of depression.  Reviewed to see if patient is improving symptom wise while in program.  Score improved and patient states that it  is because she has been able to exercise and have a little more energy. Tammy Kelley is doing well mentally. She has had some stress from taking care of her sick husband who had cancer which lead to multiple other health problems, however, he is doing better which has relieved some stress off from her. She has good support from friends and goes out and does activities like pedicures which keeps her busy. Her sleep is "up and down" but does not take anything for it and is not interested in taking anything at this time. Her stres relief is visiting her niece and her 2 kids which are ages 75 and 31.      Expected Outcomes Short: Continue to attend [facility] regularly for regular exercise and social engagement. Long: Continue to improve symptoms and manage a positive mental state. Short: Continue routine attendance Long: Continue to maintain positive attitude and utilize exercise for stress management      Interventions Encouraged to attend Pulmonary Rehabilitation for the exercise Encouraged to attend Pulmonary Rehabilitation for the exercise      Continue  Psychosocial Services  Follow up required by staff Follow up required by staff               Psychosocial Discharge (Final Psychosocial Re-Evaluation):  Psychosocial Re-Evaluation - 02/10/22 1511       Psychosocial Re-Evaluation   Current issues with Current Sleep Concerns;Current Stress Concerns    Comments Tammy Kelley is doing well mentally. She has had some stress from taking care of her sick husband who had cancer which lead to multiple other health problems, however, he is doing better which has relieved some stress off from her. She has good support from friends and goes out and does activities like pedicures which keeps her busy. Her sleep is "up and down" but does not take anything for it and is not interested in taking anything at this time. Her stres relief is visiting her niece and her 2 kids which are ages 67 and 89.    Expected Outcomes Short: Continue routine attendance Long: Continue to maintain positive attitude and utilize exercise for stress management    Interventions Encouraged to attend Pulmonary Rehabilitation for the exercise    Continue Psychosocial Services  Follow up required by staff             Education: Education Goals: Education classes will be provided on a weekly basis, covering required topics. Participant will state understanding/return demonstration of topics presented.  Learning Barriers/Preferences:  Learning Barriers/Preferences - 12/18/21 1539       Learning Barriers/Preferences   Learning Barriers None    Learning Preferences None             General Pulmonary Education Topics:  Infection Prevention: - Provides verbal and written material to individual with discussion of infection control including proper hand washing and proper equipment cleaning during exercise session. Flowsheet Row Pulmonary Rehab from 03/26/2022 in [facility] Cardiac and Pulmonary Rehab  Education need identified 01/01/22  Date 01/01/22  Educator [place]  Instruction  Review Code 1- Verbalizes Understanding       Falls Prevention: - Provides verbal and written material to individual with discussion of falls prevention and safety. Flowsheet Row Pulmonary Rehab from 03/26/2022 in [facility] Cardiac and Pulmonary Rehab  Education need identified 01/01/22  Date 01/01/22  Educator [place]  Instruction Review Code 1- Verbalizes Understanding       Chronic Lung Disease Review: - Group verbal instruction with posters, models, PowerPoint presentations and  videos,  to review new updates, new respiratory medications, new advancements in procedures and treatments. Providing information on websites and "800" numbers for continued self-education. Includes information about supplement oxygen, available portable oxygen systems, continuous and intermittent flow rates, oxygen safety, concentrators, and Medicare reimbursement for oxygen. Explanation of Pulmonary Drugs, including class, frequency, complications, importance of spacers, rinsing mouth after steroid MDI's, and proper cleaning methods for nebulizers. Review of basic lung anatomy and physiology related to function, structure, and complications of lung disease. Review of risk factors. Discussion about methods for diagnosing sleep apnea and types of masks and machines for OSA. Includes a review of the use of types of environmental controls: home humidity, furnaces, filters, dust mite/pet prevention, HEPA vacuums. Discussion about weather changes, air quality and the benefits of nasal washing. Instruction on Warning signs, infection symptoms, calling MD promptly, preventive modes, and value of vaccinations. Review of effective airway clearance, coughing and/or vibration techniques. Emphasizing that all should Create an Action Plan. Written material given at graduation. Flowsheet Row Pulmonary Rehab from 03/26/2022 in [facility] Cardiac and Pulmonary Rehab  Education need identified 01/01/22  Date 02/19/22  Educator [facility]  Instruction Review  Code 1- Verbalizes Understanding       AED/CPR: - Group verbal and written instruction with the use of models to demonstrate the basic use of the AED with the basic ABC's of resuscitation.    Anatomy and Cardiac Procedures: - Group verbal and visual presentation and models provide information about basic cardiac anatomy and function. Reviews the testing methods done to diagnose heart disease and the outcomes of the test results. Describes the treatment choices: Medical Management, Angioplasty, or Coronary Bypass Surgery for treating various heart conditions including Myocardial Infarction, Angina, Valve Disease, and Cardiac Arrhythmias.  Written material given at graduation. Flowsheet Row Pulmonary Rehab from 03/26/2022 in [facility] Cardiac and Pulmonary Rehab  Date 02/19/22  Educator jh  Instruction Review Code 1- Verbalizes Understanding       Medication Safety: - Group verbal and visual instruction to review commonly prescribed medications for heart and lung disease. Reviews the medication, class of the drug, and side effects. Includes the steps to properly store meds and maintain the prescription regimen.  Written material given at graduation.   Other: -Provides group and verbal instruction on various topics (see comments)   Knowledge Questionnaire Score:  Knowledge Questionnaire Score - 01/01/22 1159       Knowledge Questionnaire Score   Pre Score 15/18: Oxygen              Core Components/Risk Factors/Patient Goals at Admission:  Personal Goals and Risk Factors at Admission - 01/01/22 1223       Core Components/Risk Factors/Patient Goals on Admission    Weight Management Yes;Weight Loss    Intervention Weight Management: Develop a combined nutrition and exercise program designed to reach desired caloric intake, while maintaining appropriate intake of nutrient and fiber, sodium and fats, and appropriate energy expenditure required for the weight goal.;Weight Management:  Provide education and appropriate resources to help participant work on and attain dietary goals.;Weight Management/Obesity: Establish reasonable short term and long term weight goals.    Admit Weight 166 lb (75.3 kg)    Goal Weight: Short Term 161 lb (73 kg)    Goal Weight: Long Term 156 lb (70.8 kg)    Expected Outcomes Long Term: Adherence to nutrition and physical activity/exercise program aimed toward attainment of established weight goal;Short Term: Continue to assess and modify interventions until short term weight  is achieved;Weight Loss: Understanding of general recommendations for a balanced deficit meal plan, which promotes 1-2 lb weight loss per week and includes a negative energy balance of [id number] kcal/d;Understanding recommendations for meals to include 15-35% energy as protein, 25-35% energy from fat, 35-60% energy from carbohydrates, less than 257m of dietary cholesterol, 20-35 gm of total fiber daily;Understanding of distribution of calorie intake throughout the day with the consumption of 4-5 meals/snacks    Improve shortness of breath with ADL's Yes    Intervention Provide education, individualized exercise plan and daily activity instruction to help decrease symptoms of SOB with activities of daily living.    Expected Outcomes Short Term: Improve cardiorespiratory fitness to achieve a reduction of symptoms when performing ADLs;Long Term: Be able to perform more ADLs without symptoms or delay the onset of symptoms    Increase knowledge of respiratory medications and ability to use respiratory devices properly  Yes    Intervention Provide education and demonstration as needed of appropriate use of medications, inhalers, and oxygen therapy.    Expected Outcomes Short Term: Achieves understanding of medications use. Understands that oxygen is a medication prescribed by physician. Demonstrates appropriate use of inhaler and oxygen therapy.;Long Term: Maintain appropriate use of  medications, inhalers, and oxygen therapy.    Hypertension Yes    Intervention Provide education on lifestyle modifcations including regular physical activity/exercise, weight management, moderate sodium restriction and increased consumption of fresh fruit, vegetables, and low fat dairy, alcohol moderation, and smoking cessation.;Monitor prescription use compliance.    Expected Outcomes Short Term: Continued assessment and intervention until BP is < 140/933mHG in hypertensive participants. < 130/8082mG in hypertensive participants with diabetes, heart failure or chronic kidney disease.;Long Term: Maintenance of blood pressure at goal levels.             Education:Diabetes - Individual verbal and written instruction to review signs/symptoms of diabetes, desired ranges of glucose level fasting, after meals and with exercise. Acknowledge that pre and post exercise glucose checks will be done for 3 sessions at entry of program.   Know Your Numbers and Heart Failure: - Group verbal and visual instruction to discuss disease risk factors for cardiac and pulmonary disease and treatment options.  Reviews associated critical values for Overweight/Obesity, Hypertension, Cholesterol, and Diabetes.  Discusses basics of heart failure: signs/symptoms and treatments.  Introduces Heart Failure Zone chart for action plan for heart failure.  Written material given at graduation.   Core Components/Risk Factors/Patient Goals Review:   Goals and Risk Factor Review     Row Name 01/20/22 1342 02/10/22 1517 03/19/22 1408         Core Components/Risk Factors/Patient Goals Review   Personal Goals Review -- Hypertension;Improve shortness of breath with ADL's Hypertension;Improve shortness of breath with ADL's     Review Spoke to patient about their shortness of breath and what they can do to improve. Patient has been informed of breathing techniques when starting the program. Patient is informed to tell staff if  they have had any med changes and that certain meds they are taking or not taking can be causing shortness of breath. Tammy Kelley doing well. She's been checking her BP which "fluctuates" around. I encouraged her to keep a log to keep track of her BPs. Her BP at rehab range around [id number] and [id number]. She is staying compliant with her medications. Overall, she feels her breathing has improved. Outside, she was not able to walk this route that had a hill  and now she is able to do it with better breathing. Tammy Kelley has been doing ok while out of rehab; she has been out for about a month due to her husband passing away. Just checked in with Thailyn to see how she has been doing. She hasn't taken her BP at home recently - encouraged her to check BP at home when not at rehab. She is taking all of her medciation as prescibed with no reported issues. She reports her shortness of breath is about the same since April.     Expected Outcomes Short: Attend [facility] regularly to improve shortness of breath with ADL's. Long: maintain independence with ADL's Short: Keep log of BP at home Long: Continue to manage lifestyle risk factors Short: take BP when not at rehab Long: Continue to manage lifestyle risk factors              Core Components/Risk Factors/Patient Goals at Discharge (Final Review):   Goals and Risk Factor Review - 03/19/22 1408       Core Components/Risk Factors/Patient Goals Review   Personal Goals Review Hypertension;Improve shortness of breath with ADL's    Review Tammy Kelley has been doing ok while out of rehab; she has been out for about a month due to her husband passing away. Just checked in with Tammy Kelley to see how she has been doing. She hasn't taken her BP at home recently - encouraged her to check BP at home when not at rehab. She is taking all of her medciation as prescibed with no reported issues. She reports her shortness of breath is about the same since April.    Expected Outcomes  Short: take BP when not at rehab Long: Continue to manage lifestyle risk factors             ITP Comments:  ITP Comments     Row Name 12/18/21 1549 01/01/22 1158 01/15/22 1340 01/29/22 0920 01/30/22 1312   ITP Comments Initial telephone orientation completed. Diagnosis can be found in [facility] 12/19. EP orientation scheduled for Wednesday 2/22 at 10:30am. Completed 6MWT and gym orientation. Initial ITP created and sent for review to Dr. Emily Filbert, Medical Director. First full day of exercise!  Patient was oriented to gym and equipment including functions, settings, policies, and procedures.  Patient's individual exercise prescription and treatment plan were reviewed.  All starting workloads were established based on the results of the 6 minute walk test done at initial orientation visit.  The plan for exercise progression was also introduced and progression will be customized based on patient's performance and goals. 30 Day review completed. Medical Director ITP review done, changes made as directed, and signed approval by Medical Director.   New to program Completed initial RD consultation    Row Name 03/03/22 [id number] 03/18/22 1605 03/26/22 0923 04/15/22 1344     ITP Comments Patient called to let us know her husband passed away and will be out for a couple weeks. Told her to take whatever time she needs and let us know in a couple weeks what she will decide to do with rehab. Pt returned this week to rehab after  being out with death of her husband. 30 Day review completed. Medical Director ITP review done, changes made as directed, and signed approval by Medical Director. Pt called, she was recently admitted with pneumonia and has several appointments and vacations coming up.  She would like to discharge at this time.  Comments: Discharge ITP

## 2022-05-05 ENCOUNTER — Ambulatory Visit: Payer: Medicare Other

## 2022-05-07 ENCOUNTER — Ambulatory Visit: Payer: Medicare Other

## 2022-05-10 DEATH — deceased

## 2022-05-12 ENCOUNTER — Ambulatory Visit: Payer: Medicare Other

## 2022-05-14 ENCOUNTER — Ambulatory Visit: Payer: Medicare Other

## 2022-05-19 ENCOUNTER — Ambulatory Visit: Payer: Medicare Other

## 2022-05-21 ENCOUNTER — Ambulatory Visit: Payer: Medicare Other

## 2022-05-26 ENCOUNTER — Ambulatory Visit: Payer: Medicare Other

## 2022-05-28 ENCOUNTER — Ambulatory Visit: Payer: Medicare Other

## 2022-06-02 ENCOUNTER — Ambulatory Visit: Payer: Medicare Other

## 2022-06-04 ENCOUNTER — Ambulatory Visit: Payer: Medicare Other

## 2022-06-09 ENCOUNTER — Ambulatory Visit: Payer: Medicare Other
